# Patient Record
Sex: Female | Born: 1935 | Race: White | Hispanic: No | Smoking: Former smoker
Health system: Southern US, Community
[De-identification: ages and names within clinical notes are randomized; demographics above are authoritative.]

## PROBLEM LIST (undated history)

## (undated) ENCOUNTER — Emergency Department: Admission: EM | Payer: Medicare Other | Source: Home / Self Care

## (undated) DIAGNOSIS — E785 Hyperlipidemia, unspecified: Secondary | ICD-10-CM

## (undated) DIAGNOSIS — E119 Type 2 diabetes mellitus without complications: Secondary | ICD-10-CM

## (undated) DIAGNOSIS — E039 Hypothyroidism, unspecified: Secondary | ICD-10-CM

## (undated) DIAGNOSIS — F0390 Unspecified dementia without behavioral disturbance: Secondary | ICD-10-CM

## (undated) DIAGNOSIS — I10 Essential (primary) hypertension: Secondary | ICD-10-CM

---

## 2015-08-21 ENCOUNTER — Emergency Department
Admission: EM | Admit: 2015-08-21 | Discharge: 2015-08-22 | Disposition: A | Discharge status: Home / Self Care | Payer: Medicare Other | Attending: Emergency Medicine | Admitting: Emergency Medicine

## 2015-08-21 ENCOUNTER — Encounter: Payer: Self-pay | Admitting: *Deleted

## 2015-08-21 DIAGNOSIS — Y92129 Unspecified place in nursing home as the place of occurrence of the external cause: Secondary | ICD-10-CM | POA: Insufficient documentation

## 2015-08-21 DIAGNOSIS — E119 Type 2 diabetes mellitus without complications: Secondary | ICD-10-CM | POA: Insufficient documentation

## 2015-08-21 DIAGNOSIS — Y9389 Activity, other specified: Secondary | ICD-10-CM | POA: Insufficient documentation

## 2015-08-21 DIAGNOSIS — Y998 Other external cause status: Secondary | ICD-10-CM | POA: Diagnosis not present

## 2015-08-21 DIAGNOSIS — X58XXXA Exposure to other specified factors, initial encounter: Secondary | ICD-10-CM | POA: Diagnosis not present

## 2015-08-21 DIAGNOSIS — Z87891 Personal history of nicotine dependence: Secondary | ICD-10-CM | POA: Insufficient documentation

## 2015-08-21 DIAGNOSIS — F0390 Unspecified dementia without behavioral disturbance: Secondary | ICD-10-CM | POA: Insufficient documentation

## 2015-08-21 DIAGNOSIS — T50901A Poisoning by unspecified drugs, medicaments and biological substances, accidental (unintentional), initial encounter: Secondary | ICD-10-CM | POA: Insufficient documentation

## 2015-08-21 HISTORY — DX: Type 2 diabetes mellitus without complications: E11.9

## 2015-08-21 HISTORY — DX: Hypothyroidism, unspecified: E03.9

## 2015-08-21 HISTORY — DX: Unspecified dementia, unspecified severity, without behavioral disturbance, psychotic disturbance, mood disturbance, and anxiety: F03.90

## 2015-08-21 HISTORY — DX: Hyperlipidemia, unspecified: E78.5

## 2015-08-21 NOTE — ED Notes (Addendum)
Pt to ED from [facility] after pt was given the wrong medication. Per EMS pt was given Propafenone, of unknown amount, route, or time. On arrival, pt had no idea why here, staff at facility did not tell pt. Upon arrival pt slightly agitated wondering "why am I here." Pt made aware of medication error and pt continues to deny pain, nausea, vomiting, HA, dizziness, lightheadedness, chest pain or SOB. Vitals all wnl. Pt AAOx3, no acute distress noted.

## 2015-08-22 ENCOUNTER — Encounter: Payer: Self-pay | Admitting: Emergency Medicine

## 2015-08-22 DIAGNOSIS — E119 Type 2 diabetes mellitus without complications: Secondary | ICD-10-CM | POA: Diagnosis not present

## 2015-08-22 DIAGNOSIS — T50901A Poisoning by unspecified drugs, medicaments and biological substances, accidental (unintentional), initial encounter: Secondary | ICD-10-CM | POA: Diagnosis not present

## 2015-08-22 DIAGNOSIS — Y998 Other external cause status: Secondary | ICD-10-CM | POA: Diagnosis not present

## 2015-08-22 DIAGNOSIS — Z87891 Personal history of nicotine dependence: Secondary | ICD-10-CM | POA: Diagnosis not present

## 2015-08-22 DIAGNOSIS — X58XXXA Exposure to other specified factors, initial encounter: Secondary | ICD-10-CM | POA: Diagnosis not present

## 2015-08-22 DIAGNOSIS — Y9389 Activity, other specified: Secondary | ICD-10-CM | POA: Diagnosis not present

## 2015-08-22 DIAGNOSIS — Y92129 Unspecified place in nursing home as the place of occurrence of the external cause: Secondary | ICD-10-CM | POA: Diagnosis not present

## 2015-08-22 DIAGNOSIS — F0390 Unspecified dementia without behavioral disturbance: Secondary | ICD-10-CM | POA: Diagnosis not present

## 2015-08-22 NOTE — Discharge Instructions (Signed)
Though Ms. Axford received an accidental dose of medication tonight, more than 4 hours after the ingestion she has no evidence of any abnormal vital signs, her EKG is normal, and her cardiac tracing has been normal on the heart monitor.  Poison control was consulted and they felt that 4 hours is the appropriate amount of time for observation after the ingestion, so we believe she is safe for discharge at this time.  Please follow up with her regular doctor as needed.  Return to the emergency department with new or worsening symptoms that concern you.  Nontoxic Ingestion Your exam shows your ingestion is not likely to cause serious medical problems. Further treatment is not needed at this time. If you have vomited since your ingestion, you should not drink or eat for at least 2 to 3 hours. Then start with small sips of clear liquids until your stomach settles. You should not drink alcohol or take illegal recreational drugs or other mind-altering substances as this may worsen your condition. Sometimes the effects of drugs and other substances can be delayed. SEEK IMMEDIATE MEDICAL CARE IF:  You develop confusion, sleepiness, agitation, or difficulty walking.  You develop breathing problems, a cough, difficulty swallowing, or excess mucus.  You develop a stomach ache, repeated vomiting, or severe diarrhea.  You develop weakness, fever, or dehydration. Document Released: 12/15/2004 Document Revised: 01/30/2012 Document Reviewed: 12/08/2008 [facility] Patient Information 2015 [place], [place]. This information is not intended to replace advice given to you by your health care provider. Make sure you discuss any questions you have with your health care provider.

## 2015-08-22 NOTE — ED Notes (Signed)
MD Forbach at bedside. 

## 2015-08-22 NOTE — ED Notes (Signed)
[facility] called regarding medications specifics: 150 mg Propafenone given @ 2015, non extended release. MD Karma Greaser made aware at this time, no further orders given.

## 2015-08-22 NOTE — ED Provider Notes (Signed)
[facility] Emergency Department Provider Note  ____________________________________________  Time seen: Approximately 12:00 AM  I have reviewed the triage vital signs and the nursing notes.   HISTORY  Chief Complaint Medication Reaction  Based on her MAR, the patient has mild dementia and does not seem completely sure of her current living situation and surroundings although she is alert and conversant   HPI Miranda Clay is a 80 y.o. female with a medical history of uncomplicated diabetes, probable mild dementia, hypothyroidism, and hyperlipidemia who presents from [facility] facility after the patient was given the wrong medication by accident.The circumstances are unclear, but reportedly she was given propafenone, presumably intended for a different patient.  EMS was not told at what time the ingestion occurred, the dosing, or whether it was immediate or extended release.  The patient is completely asymptomatic and was surprised to find out why she was sent.  She specifically denies headache, dizziness, lightheadedness, nausea, vomiting, chest pain, shortness of breath, abdominal pain, dysuria, weakness, and numbness or tingling in her extremities.   Past Medical History  Diagnosis Date  . Diabetes mellitus without complication ([place])   . Dementia   . Hypothyroidism   . Hyperlipidemia     There are no active problems to display for this patient.   History reviewed. No pertinent past surgical history.  No current outpatient prescriptions on file.  Allergies Review of patient's allergies indicates no known allergies.  History reviewed. No pertinent family history.  Social History Social History  Substance Use Topics  . Smoking status: Former Research scientist (life sciences)  . Smokeless tobacco: None  . Alcohol Use: No    Review of Systems Constitutional: No fever/chills Eyes: No visual changes. ENT: No sore throat. Cardiovascular: Denies chest  pain. Respiratory: Denies shortness of breath. Gastrointestinal: No abdominal pain.  No nausea, no vomiting.  No diarrhea.  No constipation. Genitourinary: Negative for dysuria. Musculoskeletal: Negative for back pain. Skin: Negative for rash. Neurological: Negative for headaches, focal weakness or numbness.  10-point ROS otherwise negative.  ____________________________________________   PHYSICAL EXAM:  VITAL SIGNS: ED Triage Vitals  Enc Vitals Group     BP 08/21/15 2337 145/77 mmHg     Pulse Rate 08/21/15 2337 72     Resp 08/21/15 2337 15     Temp 08/21/15 2337 97.4 F (36.3 C)     Temp Source 08/21/15 2337 Oral     SpO2 08/21/15 2337 95 %     Weight 08/21/15 2337 114 lb (51.71 kg)     Height 08/21/15 2337 5\' 2"  (1.575 m)     Head Cir --      Peak Flow --      Pain Score --      Pain Loc --      Pain Edu? --      Excl. in [place]? --     Constitutional: Alert and oriented to self and location. Well appearing and in no acute distress. Eyes: Conjunctivae are normal. PERRL. EOMI. Head: Atraumatic. Nose: No congestion/rhinnorhea. Mouth/Throat: Mucous membranes are moist.  Oropharynx non-erythematous. Neck: No stridor.   Cardiovascular: Normal rate, regular rhythm. Grossly normal heart sounds.  Good peripheral circulation. Respiratory: Normal respiratory effort.  No retractions. Lungs CTAB. Gastrointestinal: Soft and nontender. No distention. No abdominal bruits. No CVA tenderness. Musculoskeletal: No lower extremity tenderness nor edema.  No joint effusions. Neurologic:  Normal speech and language. No gross focal neurologic deficits are appreciated.  Skin:  Skin is warm, dry and intact.  No rash noted. Psychiatric: Mood and affect are normal. Speech and behavior are normal.  When asked questions about her living situation she seems to confabulate somewhat, I suspect due to mild dementia and slight confusion, but she is in no distress and is very appropriate and also is  appropriately expressing dismay over having been given the wrong medication.  ____________________________________________   LABS (all labs ordered are listed, but only abnormal results are displayed)  Labs Reviewed - No data to display ____________________________________________  EKG  ED ECG REPORT I, Orbin Mayeux, the attending physician, personally viewed and interpreted this ECG.  Date: 08/22/2015 EKG Time: 00:24 Rate: 81 Rhythm: normal sinus rhythm with mild sinus arrhythmia QRS Axis: normal Intervals: normal.  QTC equals 473 ms ST/T Wave abnormalities: normal Conduction Disutrbances: none Narrative Interpretation: unremarkable  ____________________________________________  RADIOLOGY   No results found.  ____________________________________________   PROCEDURES  Procedure(s) performed: None  Critical Care performed: No ____________________________________________   INITIAL IMPRESSION / ASSESSMENT AND PLAN / ED COURSE  Pertinent labs & imaging results that were available during my care of the patient were reviewed by me and considered in my medical decision making (see chart for details).  I called the [facility] and spoke with [place].  She stated that there are no specific labs to check and agreed with my plan to get an EKG.  She stated that it is important to know whether it was immediate release or extended release.  If the patient has no sign of arrhythmias after 4 hours after the immediate release dose, or 8 hours after the extended-release dose, the patient is safe for return to her nursing facility.  The patient's nurse is attempting to call and obtain more information from the nursing facility so that we can know exactly when the medication was given.  ----------------------------------------- 1:00 AM on 08/22/2015 -----------------------------------------  Our pharmacy technician called the facility and found out that the  medication given was immediate release 150 mg and that it was administered at 20:15.  This means that she has artery been observed for more than the recommended 4 hours after dose was given.  Her EKG is reassuring, she has had no arrhythmias on the heart monitor, and her blood pressure is appropriate.  Her family is now at the bedside and I discussed all of this with them extensively.  They are going to take the patient home.  She remains asymptomatic and I believe there are no other interventions that are indicated at this time. ____________________________________________  FINAL CLINICAL IMPRESSION(S) / ED DIAGNOSES  Final diagnoses:  Accidental medication error      NEW MEDICATIONS STARTED DURING THIS VISIT:  New Prescriptions   No medications on file     Hinda Kehr, MD 08/22/15 0104

## 2016-01-10 ENCOUNTER — Emergency Department: Payer: Medicare Other

## 2016-01-10 ENCOUNTER — Emergency Department
Admission: EM | Admit: 2016-01-10 | Discharge: 2016-01-10 | Disposition: A | Discharge status: Home / Self Care | Payer: Medicare Other | Attending: Emergency Medicine | Admitting: Emergency Medicine

## 2016-01-10 DIAGNOSIS — F0390 Unspecified dementia without behavioral disturbance: Secondary | ICD-10-CM | POA: Insufficient documentation

## 2016-01-10 DIAGNOSIS — Z7984 Long term (current) use of oral hypoglycemic drugs: Secondary | ICD-10-CM | POA: Diagnosis not present

## 2016-01-10 DIAGNOSIS — R531 Weakness: Secondary | ICD-10-CM | POA: Insufficient documentation

## 2016-01-10 DIAGNOSIS — N390 Urinary tract infection, site not specified: Secondary | ICD-10-CM | POA: Diagnosis not present

## 2016-01-10 DIAGNOSIS — Z792 Long term (current) use of antibiotics: Secondary | ICD-10-CM | POA: Insufficient documentation

## 2016-01-10 DIAGNOSIS — Z87891 Personal history of nicotine dependence: Secondary | ICD-10-CM | POA: Insufficient documentation

## 2016-01-10 DIAGNOSIS — E119 Type 2 diabetes mellitus without complications: Secondary | ICD-10-CM | POA: Insufficient documentation

## 2016-01-10 DIAGNOSIS — Z7982 Long term (current) use of aspirin: Secondary | ICD-10-CM | POA: Diagnosis not present

## 2016-01-10 DIAGNOSIS — R4182 Altered mental status, unspecified: Secondary | ICD-10-CM | POA: Diagnosis present

## 2016-01-10 DIAGNOSIS — R410 Disorientation, unspecified: Secondary | ICD-10-CM | POA: Diagnosis not present

## 2016-01-10 DIAGNOSIS — Z79899 Other long term (current) drug therapy: Secondary | ICD-10-CM | POA: Insufficient documentation

## 2016-01-10 LAB — URINALYSIS COMPLETE WITH MICROSCOPIC (ARMC ONLY)
Bilirubin Urine: NEGATIVE
GLUCOSE, UA: 50 mg/dL — AB
Ketones, ur: NEGATIVE mg/dL
Nitrite: POSITIVE — AB
PROTEIN: NEGATIVE mg/dL
Specific Gravity, Urine: 1.011 (ref 1.005–1.030)
pH: 7 (ref 5.0–8.0)

## 2016-01-10 LAB — CBC WITH DIFFERENTIAL/PLATELET
BASOS ABS: 0.1 10*3/uL (ref 0–0.1)
Basophils Relative: 1 %
Eosinophils Absolute: 0.3 10*3/uL (ref 0–0.7)
Eosinophils Relative: 4 %
HEMATOCRIT: 40.7 % (ref 35.0–47.0)
HEMOGLOBIN: 13.6 g/dL (ref 12.0–16.0)
LYMPHS ABS: 2.2 10*3/uL (ref 1.0–3.6)
LYMPHS PCT: 28 %
MCH: 31.2 pg (ref 26.0–34.0)
MCHC: 33.4 g/dL (ref 32.0–36.0)
MCV: 93.5 fL (ref 80.0–100.0)
Monocytes Absolute: 0.7 10*3/uL (ref 0.2–0.9)
Monocytes Relative: 9 %
NEUTROS ABS: 4.5 10*3/uL (ref 1.4–6.5)
Neutrophils Relative %: 58 %
Platelets: 283 10*3/uL (ref 150–440)
RBC: 4.35 MIL/uL (ref 3.80–5.20)
RDW: 13.9 % (ref 11.5–14.5)
WBC: 7.7 10*3/uL (ref 3.6–11.0)

## 2016-01-10 LAB — COMPREHENSIVE METABOLIC PANEL
ALK PHOS: 61 U/L (ref 38–126)
ALT: 13 U/L — AB (ref 14–54)
AST: 19 U/L (ref 15–41)
Albumin: 4 g/dL (ref 3.5–5.0)
Anion gap: 8 (ref 5–15)
BUN: 14 mg/dL (ref 6–20)
CALCIUM: 9.1 mg/dL (ref 8.9–10.3)
CHLORIDE: 107 mmol/L (ref 101–111)
CO2: 27 mmol/L (ref 22–32)
CREATININE: 0.42 mg/dL — AB (ref 0.44–1.00)
Glucose, Bld: 202 mg/dL — ABNORMAL HIGH (ref 65–99)
Potassium: 3.4 mmol/L — ABNORMAL LOW (ref 3.5–5.1)
Sodium: 142 mmol/L (ref 135–145)
Total Bilirubin: 0.4 mg/dL (ref 0.3–1.2)
Total Protein: 6.8 g/dL (ref 6.5–8.1)

## 2016-01-10 LAB — TROPONIN I

## 2016-01-10 MED ORDER — DEXTROSE 5 % IV SOLN
1.0000 g | INTRAVENOUS | Status: AC
  Administered 2016-01-10: 1 g via INTRAVENOUS
  Filled 2016-01-10: qty 10

## 2016-01-10 MED ORDER — CEPHALEXIN 500 MG PO CAPS: 500.0000 mg | ORAL_CAPSULE | Freq: Two times a day (BID) | ORAL | Status: DC

## 2016-01-10 NOTE — ED Notes (Signed)
Pt repeatedly asking how she came to the ED. Pt does not remember the trip, or why she is here. Pt has been informed why she is in the ED, but needs to be continually reminded.

## 2016-01-10 NOTE — Discharge Instructions (Signed)
Ms. Hallen is a urinary tract infection that we believe is likely causing her confusion.  Her lab results and physical exam are otherwise quite reassuring and we believe that she will improve with outpatient antibiotics.  She received 1 dose of ceftriaxone 1 g IV while in the emergency department and she should take her prescribed medication as indicated starting today.  Please follow up with her regular doctor at the next available opportunity.  If she develops new or worsening symptoms that concern you, please return to the emergency department.   Urinary Tract Infection Urinary tract infections (UTIs) can develop anywhere along your urinary tract. Your urinary tract is your body's drainage system for removing wastes and extra water. Your urinary tract includes two kidneys, two ureters, a bladder, and a urethra. Your kidneys are a pair of bean-shaped organs. Each kidney is about the size of your fist. They are located below your ribs, one on each side of your spine. CAUSES Infections are caused by microbes, which are microscopic organisms, including fungi, viruses, and bacteria. These organisms are so small that they can only be seen through a microscope. Bacteria are the microbes that most commonly cause UTIs. SYMPTOMS  Symptoms of UTIs may vary by age and gender of the patient and by the location of the infection. Symptoms in young women typically include a frequent and intense urge to urinate and a painful, burning feeling in the bladder or urethra during urination. Older women and men are more likely to be tired, shaky, and weak and have muscle aches and abdominal pain. A fever may mean the infection is in your kidneys. Other symptoms of a kidney infection include pain in your back or sides below the ribs, nausea, and vomiting. DIAGNOSIS To diagnose a UTI, your caregiver will ask you about your symptoms. Your caregiver will also ask you to provide a urine sample. The urine sample will be tested for  bacteria and white blood cells. White blood cells are made by your body to help fight infection. TREATMENT  Typically, UTIs can be treated with medication. Because most UTIs are caused by a bacterial infection, they usually can be treated with the use of antibiotics. The choice of antibiotic and length of treatment depend on your symptoms and the type of bacteria causing your infection. HOME CARE INSTRUCTIONS  If you were prescribed antibiotics, take them exactly as your caregiver instructs you. Finish the medication even if you feel better after you have only taken some of the medication.  Drink enough water and fluids to keep your urine clear or pale yellow.  Avoid caffeine, tea, and carbonated beverages. They tend to irritate your bladder.  Empty your bladder often. Avoid holding urine for long periods of time.  Empty your bladder before and after sexual intercourse.  After a bowel movement, women should cleanse from front to back. Use each tissue only once. SEEK MEDICAL CARE IF:   You have back pain.  You develop a fever.  Your symptoms do not begin to resolve within 3 days. SEEK IMMEDIATE MEDICAL CARE IF:   You have severe back pain or lower abdominal pain.  You develop chills.  You have nausea or vomiting.  You have continued burning or discomfort with urination. MAKE SURE YOU:   Understand these instructions.  Will watch your condition.  Will get help right away if you are not doing well or get worse.   This information is not intended to replace advice given to you by your health  care provider. Make sure you discuss any questions you have with your health care provider.   Document Released: 08/17/2005 Document Revised: 07/29/2015 Document Reviewed: 12/16/2011 Elsevier Interactive Patient Education [employer].  Delirium Delirium is a state of mental confusion. It comes on quickly and causes significant changes in a person's thinking and behavior. People  with delirium usually have trouble paying attention to what is going on or knowing where they are. They may become very withdrawn or very emotional and unable to sit still. They may even see or feel things that are not there (hallucinations). Delirium is a sign of a serious underlying medical condition. CAUSES Delirium occurs when something suddenly affects the signals that the brain sends out. Brain signals can be affected by anything that puts severe stress on the body and brain and causes brain chemicals to be out of balance. The most common causes of delirium include:  Infections. These may be bacterial, viral, fungal, or protozoal.  Medicines. These include many over-the-counter and prescription medicines.  Recreational drugs.  Substance withdrawal. This occurs with sudden discontinuation of alcohol, certain medicines, or recreational drugs.  Surgery.  Sudden vascular events, such as stroke, brain hemorrhage, and severe migraine.  Other brain disorders, such as tumors, seizures, and physical head trauma.  Metabolic disorders, such as kidney or liver failure.  Low blood oxygen (anoxia). This may occur with lung disease, cardiac arrest, or carbon monoxide poisoning.  Hormone imbalances (endocrinopathies), such as an overactive thyroid (hyperthyroidism) or underactive thyroid (hypothyroidism).  Vitamin deficiencies. RISK FACTORS This condition is more likely to develop in:  Children.  Older people.  People who live alone.  People who have vision loss or hearing loss.  People who have existing brain disease, such as dementia.  People who have long-lasting (chronic) medical conditions, such as heart disease.  People who are hospitalized for long periods of time. SYMPTOMS Delirium starts with a sudden change in a person's thinking or behavior. Symptoms come and go (fluctuate) over time, and they are often worse at the end of the day. Symptoms include:  Not being able to  stay awake (drowsiness) or pay attention.  Being confused about places, time, and people.  Forgetfulness.  Having extreme energy levels. These may be low or high.  Changes in sleep patterns.  Extreme mood swings, such as anger or anxiety.  Focusing on things or ideas that are not important.  Rambling and senseless talking.  Difficulty speaking, understanding speech, or both.  Hallucinations.  Tremor or unsteady gait. DIAGNOSIS People with delirium may not realize that they have the condition. Often, a family member or health care provider is the first person to notice the changes. The health care provider will obtain a detailed history of current symptoms, medical issues, medicines, and recreational drug use. The health care provider will perform a mental status examination by:  Asking questions to check for confusion.  Watching for abnormal behavior. The health care provider may perform a physical exam and order lab tests or additional studies to determine the cause of the delirium. TREATMENT Treatment of delirium depends on the cause and severity. Delirium usually goes away within days or weeks of treating the underlying cause. In the meantime, the person should not be left alone because he or she may accidentally cause self-harm. Treatment includes supportive care, such as:  Increased light during the day and decreased light at night.  Low noise level.  Uninterrupted sleep.  A regular daily schedule.  Clocks and calendars to  help with orientation.  Familiar objects, including the person's pictures and clothing.  Frequent visits from familiar family and friends.  Healthy diet.  Exercise. In more severe cases of delirium, medicine may be prescribed to help the person to keep calm and think more clearly. HOME CARE INSTRUCTIONS  Any supportive care should be continued as told by the health care provider.  All medicines should be used as told by the health care  provider. This is important.  The health care provider should be consulted before over-the-counter medicines, herbs, or supplements are used.  All follow-up visits should be kept as told by the health care provider. This is important.  Alcohol and recreational drugs should be avoided as told by the health care provider. SEEK MEDICAL CARE IF:  Symptoms do not get better or they become worse.  New symptoms of delirium develop.  Caring for the person at home does not seem safe.  Eating, drinking, or communicating stops.  There are side effects of medicines, such as changes in sleep patterns, dizziness, weight gain, restlessness, movement changes, or tremors. SEEK IMMEDIATE MEDICAL CARE IF:  Serious thoughts occur about self-harm or about hurting others.  There are serious side effects of medicine, such as:  Swelling of the face, lips, tongue, or throat.  Fever, confusion, muscle spasms, or seizures.   This information is not intended to replace advice given to you by your health care provider. Make sure you discuss any questions you have with your health care provider.   Document Released: 08/01/2012 Document Revised: 03/24/2015 Document Reviewed: 12/31/2014 Elsevier Interactive Patient Education [employer].

## 2016-01-10 NOTE — ED Notes (Signed)
Patient ambulatory to toilet

## 2016-01-10 NOTE — ED Notes (Signed)
Pt asked if her daughter knew that the pt was in the ED. Pt then called her daughter and left a voicemail that she was in the ED. Pt asked approx 5 minutes later if her daughter knew she was in the ED. Pt asked again how she got the ED, and why she is here.

## 2016-01-10 NOTE — ED Notes (Signed)
Per EMS: Pt comes from [facility] Pt c/o confusion/disorientation, dyspnea, weakness, and dizziness. Pt walked to nurses station at [facility], and appeared confused to staff. Pt does not remember incident. Pt has history of dementia.  Pt normally walks 1.5 miles daily, currently feels unable to walk. Pt states: "I just feel off"

## 2016-01-10 NOTE — ED Notes (Signed)
Spoke with daughter on the phone who is aware of patient's discharge back to [facility]. Daughter also unsure if we were told she had a dx of dementia which I told her the previous shift nurse had documented her hx of dementia.

## 2016-01-10 NOTE — ED Notes (Signed)
Pt reports she does not remember where she was when EMS came and got her. Pt reports she does not know why she is in the hospital

## 2016-01-10 NOTE — ED Notes (Signed)
Patient transported to X-ray 

## 2016-01-10 NOTE — ED Provider Notes (Signed)
[facility] Emergency Department Provider Note  ____________________________________________  Time seen: Approximately 5:42 AM  I have reviewed the triage vital signs and the nursing notes.   HISTORY  Chief Complaint Altered Mental Status; Weakness; and Shortness of Breath    HPI Miranda Clay is a 81 y.o. female past medical history that includes mild dementia who presents with altered mental status primarily manifesting as confusion.  She has been a little bit weaker than normal according to the EMS report from [facility].  She is normally quite active and healthy walking 1.5 miles per day.  Upon arrival to the emergency department she was confused and not certain why she was here.  She denies any current abnormal feelings and specifically denies fever/chills, chest pain, shortness of breath, nausea, vomiting, abdominal pain, dysuria.  She does state that she has been going to the bathroom quite frequently and she has urinated multiple times in the emergency department while awaiting my evaluation.  The main complaint at this time seems to be confusion and she does require frequent redirection in the emergency department.  Overall the symptoms are reportedly quite a bit worse than her baseline mild dementia but nothing specifically has made them better or worse.  The onset was gradual.   Past Medical History  Diagnosis Date  . Diabetes mellitus without complication ([place])   . Dementia   . Hypothyroidism   . Hyperlipidemia     There are no active problems to display for this patient.   History reviewed. No pertinent past surgical history.  Current Outpatient Rx  Name  Route  Sig  Dispense  Refill  . aspirin 81 MG tablet   Oral   Take 81 mg by mouth daily.         . Cholecalciferol (VITAMIN D) 2000 UNITS tablet   Oral   Take 2,000 Units by mouth daily.         . Coenzyme Q10 (CO Q 10) 100 MG CAPS   Oral   Take 1 capsule by mouth daily.         Marland Kitchen  levothyroxine (SYNTHROID, LEVOTHROID) 25 MCG tablet   Oral   Take 25 mcg by mouth daily before breakfast.         . meclizine (ANTIVERT) 25 MG tablet   Oral   Take 25 mg by mouth 3 (three) times daily as needed for dizziness.         . memantine (NAMENDA XR) 28 MG CP24 24 hr capsule   Oral   Take 28 mg by mouth daily.         . metFORMIN (GLUCOPHAGE) 1000 MG tablet   Oral   Take 1,000 mg by mouth 2 (two) times daily with a meal.         . mirtazapine (REMERON) 15 MG tablet   Oral   Take 7.5 mg by mouth at bedtime.         . pravastatin (PRAVACHOL) 20 MG tablet   Oral   Take 20 mg by mouth daily.         . Rivastigmine 13.3 MG/24HR PT24   Transdermal   Place 1 patch onto the skin daily.         . verapamil (CALAN-SR) 120 MG CR tablet   Oral   Take 120 mg by mouth daily.         . vitamin B-12 (CYANOCOBALAMIN) 1000 MCG tablet   Oral   Take 1,000 mcg by mouth  daily.         . cephALEXin (KEFLEX) 500 MG capsule   Oral   Take 1 capsule (500 mg total) by mouth 2 (two) times daily.   14 capsule   0     Allergies Review of patient's allergies indicates no known allergies.  No family history on file.  Social History Social History  Substance Use Topics  . Smoking status: Former Research scientist (life sciences)  . Smokeless tobacco: None  . Alcohol Use: No    Review of Systems Constitutional: No fever/chills Eyes: No visual changes. ENT: No sore throat. Cardiovascular: Denies chest pain. Respiratory: Denies shortness of breath. Gastrointestinal: No abdominal pain.  No nausea, no vomiting.  No diarrhea.  No constipation. Genitourinary: Negative for dysuria. Musculoskeletal: Negative for back pain. Skin: Negative for rash. Neurological: Negative for headaches, focal weakness or numbness.  Confusion.  10-point ROS otherwise negative.  ____________________________________________   PHYSICAL EXAM:  VITAL SIGNS: ED Triage Vitals  Enc Vitals Group     BP 01/10/16  0224 150/63 mmHg     Pulse Rate 01/10/16 0229 66     Resp 01/10/16 0229 11     Temp 01/10/16 0229 97.5 F (36.4 C)     Temp Source 01/10/16 0229 Oral     SpO2 01/10/16 0229 94 %     Weight 01/10/16 0229 115 lb (52.164 kg)     Height 01/10/16 0229 5\' 2"  (1.575 m)     Head Cir --      Peak Flow --      Pain Score --      Pain Loc --      Pain Edu? --      Excl. in [place]? --     Constitutional: Alert and oriented at the time of my evaluation. Well appearing and in no acute distress. Eyes: Conjunctivae are normal. PERRL. EOMI. Head: Atraumatic. Nose: No congestion/rhinnorhea. Mouth/Throat: Mucous membranes are moist.  Oropharynx non-erythematous. Neck: No stridor.   Cardiovascular: Normal rate, regular rhythm. Grossly normal heart sounds.  Good peripheral circulation. Respiratory: Normal respiratory effort.  No retractions. Lungs CTAB. Gastrointestinal: Soft and nontender. No distention. No abdominal bruits. No CVA tenderness. Musculoskeletal: No lower extremity tenderness nor edema.  No joint effusions. Neurologic:  Normal speech and language. No gross focal neurologic deficits are appreciated.  The patient is ambulatory to and from the bathroom without assistance multiple times during her emergency department stay. Skin:  Skin is warm, dry and intact. No rash noted. Psychiatric: Mood and affect are normal. Speech and behavior are normal.  ____________________________________________   LABS (all labs ordered are listed, but only abnormal results are displayed)  Labs Reviewed  COMPREHENSIVE METABOLIC PANEL - Abnormal; Notable for the following:    Potassium 3.4 (*)    Glucose, Bld 202 (*)    Creatinine, Ser 0.42 (*)    ALT 13 (*)    All other components within normal limits  URINALYSIS COMPLETEWITH MICROSCOPIC ([facility] ONLY) - Abnormal; Notable for the following:    Color, Urine YELLOW (*)    APPearance HAZY (*)    Glucose, UA 50 (*)    Hgb urine dipstick 1+ (*)    Nitrite  POSITIVE (*)    Leukocytes, UA 1+ (*)    Bacteria, UA FEW (*)    Squamous Epithelial / LPF 0-5 (*)    All other components within normal limits  URINE CULTURE  TROPONIN I  CBC WITH DIFFERENTIAL/PLATELET   ____________________________________________  EKG  ED ECG REPORT  I, Knut Rondinelli, the attending physician, personally viewed and interpreted this ECG.  Date: 01/10/2016 EKG Time: 02:29 Rate: 66 Rhythm: normal sinus rhythm QRS Axis: normal Intervals: normal ST/T Wave abnormalities: normal Conduction Disturbances: none Narrative Interpretation: unremarkable  ____________________________________________  RADIOLOGY   Dg Chest 2 View  01/10/2016  CLINICAL DATA:  Confusion and disorientation, dyspnea, weakness, and dizziness. History of dementia. EXAM: CHEST  2 VIEW COMPARISON:  None. FINDINGS: The heart size and mediastinal contours are within normal limits. Both lungs are clear. The visualized skeletal structures are unremarkable. IMPRESSION: No active cardiopulmonary disease. Electronically Signed   By: Lucienne Capers M.D.   On: 01/10/2016 03:33    ____________________________________________   PROCEDURES  Procedure(s) performed: None  Critical Care performed: No ____________________________________________   INITIAL IMPRESSION / ASSESSMENT AND PLAN / ED COURSE  Pertinent labs & imaging results that were available during my care of the patient were reviewed by me and considered in my medical decision making (see chart for details).  The patient has a urinary tract infection which I suspect is causing her confusion (mild delirium on top of mild chronic dementia).  However, she is very well-appearing with completely normal vital signs and otherwise unremarkable labs.  Give her dose of ceftriaxone 1 g IV several hours ago and she has tolerated it well.  The urine has been sent for culture.  Given that she is at an assisted living facility and doing quite well at this  time, I will discharge her home rather than risk worsening her delirium by bringing her into the hospital when she can adequately be treated for her UTI with oral antibiotics; I believe that hospitalization would actually cause her to decompensate more than the actual infection.  I discussed going home with the patient and [facility] is [facility].  She states she feels well and is very pleasant and appropriately interactive during our interview.  ____________________________________________  FINAL CLINICAL IMPRESSION(S) / ED DIAGNOSES  Final diagnoses:  UTI (lower urinary tract infection)  Confusion      NEW MEDICATIONS STARTED DURING THIS VISIT:  New Prescriptions   CEPHALEXIN (KEFLEX) 500 MG CAPSULE    Take 1 capsule (500 mg total) by mouth 2 (two) times daily.     Hinda Kehr, MD 01/10/16 [phone]

## 2016-01-12 LAB — URINE CULTURE
Culture: >=100000
SPECIAL REQUESTS: NORMAL

## 2017-09-13 ENCOUNTER — Emergency Department
Admission: EM | Admit: 2017-09-13 | Discharge: 2017-09-13 | Disposition: A | Discharge status: Home / Self Care | Payer: Medicare Other | Attending: Emergency Medicine | Admitting: Emergency Medicine

## 2017-09-13 ENCOUNTER — Encounter: Payer: Self-pay | Admitting: *Deleted

## 2017-09-13 DIAGNOSIS — I83899 Varicose veins of unspecified lower extremities with other complications: Secondary | ICD-10-CM | POA: Insufficient documentation

## 2017-09-13 DIAGNOSIS — Z87891 Personal history of nicotine dependence: Secondary | ICD-10-CM | POA: Diagnosis not present

## 2017-09-13 DIAGNOSIS — F0390 Unspecified dementia without behavioral disturbance: Secondary | ICD-10-CM | POA: Diagnosis not present

## 2017-09-13 DIAGNOSIS — E039 Hypothyroidism, unspecified: Secondary | ICD-10-CM | POA: Diagnosis not present

## 2017-09-13 DIAGNOSIS — Z79899 Other long term (current) drug therapy: Secondary | ICD-10-CM | POA: Diagnosis not present

## 2017-09-13 DIAGNOSIS — E119 Type 2 diabetes mellitus without complications: Secondary | ICD-10-CM | POA: Insufficient documentation

## 2017-09-13 DIAGNOSIS — Z7982 Long term (current) use of aspirin: Secondary | ICD-10-CM | POA: Insufficient documentation

## 2017-09-13 DIAGNOSIS — Z7984 Long term (current) use of oral hypoglycemic drugs: Secondary | ICD-10-CM | POA: Diagnosis not present

## 2017-09-13 NOTE — ED Triage Notes (Signed)
Pt brought in via ems from [facility].  Pt struck her left lower leg on the toilet causing a vericose vein to bleed.  Bleeding controlled

## 2017-09-13 NOTE — Discharge Instructions (Signed)
Keep dressing on the wound area until tomorrow.  Monitor for recurrence of bleeding.  If you note any bleeding do not hesitate to follow-up with primary care provider or return to the emergency department.

## 2017-09-13 NOTE — ED Provider Notes (Signed)
[facility] Emergency Department Provider Note   ____________________________________________   I have reviewed the triage vital signs and the nursing notes.   HISTORY  Chief Complaint Leg Injury    HPI Miranda Clay is a 82 y.o. female presents emergency department with with bleeding from a right lower leg varicose vein she hit on base of a toilet while going to the bathroom earlier this evening. Nursing home staff held direct pressure over the injury prior to EMS arrival.  EMS reported no bleeding during their assessment and maintaining the bandaging during transport.  No bleeding noted during history and assessment.  Intact pulses to the lower leg. Patient is an Alzheimer's dementia patient and was unable to provide history of current injury. Patient denies fever, chills, headache, vision changes, chest pain, chest tightness, shortness of breath, abdominal pain, nausea and vomiting.  Past Medical History:  Diagnosis Date  . Dementia   . Diabetes mellitus without complication ([place])   . Hyperlipidemia   . Hypothyroidism     There are no active problems to display for this patient.   No past surgical history on file.  Prior to Admission medications   Medication Sig Start Date End Date Taking? Authorizing Provider  aspirin 81 MG tablet Take 81 mg by mouth daily.    [provider]  cephALEXin (KEFLEX) 500 MG capsule Take 1 capsule (500 mg total) by mouth 2 (two) times daily. 01/10/16   Hinda Kehr, MD  Cholecalciferol (VITAMIN D) 2000 UNITS tablet Take 2,000 Units by mouth daily.    [provider]  Coenzyme Q10 (CO Q 10) 100 MG CAPS Take 1 capsule by mouth daily.    [provider]  levothyroxine (SYNTHROID, LEVOTHROID) 25 MCG tablet Take 25 mcg by mouth daily before breakfast.    [provider]  meclizine (ANTIVERT) 25 MG tablet Take 25 mg by mouth 3 (three) times daily as needed for dizziness.    [provider]  memantine (NAMENDA XR) 28 MG CP24 24 hr capsule Take 28 mg by mouth daily.    [provider]  metFORMIN (GLUCOPHAGE) 1000 MG tablet Take 1,000 mg by mouth 2 (two) times daily with a meal.    [provider]  mirtazapine (REMERON) 15 MG tablet Take 7.5 mg by mouth at bedtime.    [provider]  pravastatin (PRAVACHOL) 20 MG tablet Take 20 mg by mouth daily.    [provider]  Rivastigmine 13.3 MG/24HR PT24 Place 1 patch onto the skin daily.    [provider]  verapamil (CALAN-SR) 120 MG CR tablet Take 120 mg by mouth daily.    [provider]  vitamin B-12 (CYANOCOBALAMIN) 1000 MCG tablet Take 1,000 mcg by mouth daily.    [provider]    Allergies Patient has no known allergies.  No family history on file.  Social History Social History  Substance Use Topics  . Smoking status: Former Research scientist (life sciences)  . Smokeless tobacco: Never Used  . Alcohol use No    Review of Systems Constitutional: Negative for fever/chills Cardiovascular: Denies chest pain. Respiratory: Denies shortness of breath. Skin: Negative for rash. Small wound area where skin tear occurred over varicose vein. Hemorrhage controlled. Neurological: Negative for headaches.  Negative focal weakness or numbness. Negative for loss of consciousness. Able to ambulate. ____________________________________________   PHYSICAL EXAM:  VITAL SIGNS: ED Triage Vitals  Enc Vitals Group     BP 09/13/17 2139 (!) 161/73  Pulse Rate 09/13/17 2139 96     Resp 09/13/17 2139 20     Temp 09/13/17 2139 98.1 F (36.7 C)     Temp Source 09/13/17 2139 Oral     SpO2 09/13/17 2139 99 %     Weight 09/13/17 2140 118 lb (53.5 kg)     Height 09/13/17 2140 5\' 2"  (1.575 m)     Head Circumference --      Peak Flow --      Pain Score --      Pain Loc --      Pain Edu? --      Excl. in [place]? --     Constitutional: Alert and oriented. Well appearing and in no acute distress.    Head: Normocephalic and atraumatic. Cardiovascular: Normal rate, regular rhythm.  Respiratory: Normal respiratory effort without tachypnea or retractions.  Musculoskeletal: Nontender with normal range of motion in all extremities. Neurologic: Normal speech and language.  Skin:  Skin is warm, dry and intact. No rash noted.Small wound area where skin tear occurred over varicose vein. Hemorrhage controlled. Intact pedal and medial malleoli pulses along the right lower leg. No active bleeding noted. Psychiatric: Mood and affect are normal. Speech and behavior are normal. Patient exhibits appropriate insight and judgement.  ____________________________________________   LABS (all labs ordered are listed, but only abnormal results are displayed)  Labs Reviewed - No data to display ____________________________________________  EKG None ____________________________________________  RADIOLOGY None ____________________________________________   PROCEDURES  Procedure(s) performed: Varicose vein wound area along the posterior lateral right lower leg examined. Hemorrhage controlled and wound area cleaned with saline and gauze. Redressed with 4 x 4 and covered with 2 inch rolled gauze.    Critical Care performed: no ____________________________________________   INITIAL IMPRESSION / ASSESSMENT AND PLAN / ED COURSE  Pertinent labs & imaging results that were available during my care of the patient were reviewed by me and considered in my medical decision making (see chart for details).  Patient presents to emergency department with right lower leg injury to small varicose vein with hemorrhage resolved. History and physical exam findings are reassuring small varicose vein injury is contained and no further hemorrhaging is present. Injury area has been redressed with 4 x 4 gauze and covered with 2 inch rolled gauze. Care instructions have been sent back with EMS to be relayed on to nursing  home staff. Patient advised to follow up with PCP as needed or return to the emergency department if symptoms return or worsen.  ____________________________________________   FINAL CLINICAL IMPRESSION(S) / ED DIAGNOSES  Final diagnoses:  Bleeding from varicose vein       NEW MEDICATIONS STARTED DURING THIS VISIT:  Discharge Medication List as of 09/13/2017  9:43 PM       Note:  This document was prepared using Dragon voice recognition software and may include unintentional dictation errors.    Jerolyn Shin, PA-C 09/14/17 0125    Carrie Mew, MD 09/14/17 [phone]

## 2017-09-13 NOTE — ED Notes (Addendum)
Pt struck left lower leg on the toilet causing a vericose vein to bleed.  No bleeding on arrival to er.  Left leg cleaned and dressed and pt returned back to [facility] via ems.  Pt alert  Hx dementia pt

## 2018-07-13 ENCOUNTER — Emergency Department: Payer: Medicare Other

## 2018-07-13 ENCOUNTER — Other Ambulatory Visit: Payer: Self-pay

## 2018-07-13 ENCOUNTER — Emergency Department
Admission: EM | Admit: 2018-07-13 | Discharge: 2018-07-13 | Disposition: A | Discharge status: Home / Self Care | Payer: Medicare Other | Attending: Emergency Medicine | Admitting: Emergency Medicine

## 2018-07-13 DIAGNOSIS — R270 Ataxia, unspecified: Secondary | ICD-10-CM | POA: Diagnosis not present

## 2018-07-13 DIAGNOSIS — Z7902 Long term (current) use of antithrombotics/antiplatelets: Secondary | ICD-10-CM | POA: Insufficient documentation

## 2018-07-13 DIAGNOSIS — F0390 Unspecified dementia without behavioral disturbance: Secondary | ICD-10-CM | POA: Diagnosis not present

## 2018-07-13 DIAGNOSIS — Z87891 Personal history of nicotine dependence: Secondary | ICD-10-CM | POA: Diagnosis not present

## 2018-07-13 DIAGNOSIS — E039 Hypothyroidism, unspecified: Secondary | ICD-10-CM | POA: Insufficient documentation

## 2018-07-13 DIAGNOSIS — E119 Type 2 diabetes mellitus without complications: Secondary | ICD-10-CM | POA: Diagnosis not present

## 2018-07-13 DIAGNOSIS — Z79899 Other long term (current) drug therapy: Secondary | ICD-10-CM | POA: Diagnosis not present

## 2018-07-13 DIAGNOSIS — Z7982 Long term (current) use of aspirin: Secondary | ICD-10-CM | POA: Insufficient documentation

## 2018-07-13 DIAGNOSIS — R4182 Altered mental status, unspecified: Secondary | ICD-10-CM

## 2018-07-13 DIAGNOSIS — Z7984 Long term (current) use of oral hypoglycemic drugs: Secondary | ICD-10-CM | POA: Insufficient documentation

## 2018-07-13 LAB — COMPREHENSIVE METABOLIC PANEL
ALT: 9 U/L (ref 0–44)
AST: 16 U/L (ref 15–41)
Albumin: 3.2 g/dL — ABNORMAL LOW (ref 3.5–5.0)
Alkaline Phosphatase: 52 U/L (ref 38–126)
Anion gap: 7 (ref 5–15)
BILIRUBIN TOTAL: 0.4 mg/dL (ref 0.3–1.2)
BUN: 23 mg/dL (ref 8–23)
CALCIUM: 9.1 mg/dL (ref 8.9–10.3)
CO2: 27 mmol/L (ref 22–32)
CREATININE: 0.62 mg/dL (ref 0.44–1.00)
Chloride: 106 mmol/L (ref 98–111)
GFR calc non Af Amer: >60 mL/min (ref >60)
Glucose, Bld: 285 mg/dL — ABNORMAL HIGH (ref 70–99)
Potassium: 3.8 mmol/L (ref 3.5–5.1)
Sodium: 140 mmol/L (ref 135–145)
TOTAL PROTEIN: 6.4 g/dL — AB (ref 6.5–8.1)

## 2018-07-13 LAB — CBC WITH DIFFERENTIAL/PLATELET
BASOS ABS: 0.1 10*3/uL (ref 0–0.1)
BASOS PCT: 1 %
EOS ABS: 0.3 10*3/uL (ref 0–0.7)
Eosinophils Relative: 3 %
HCT: 37.4 % (ref 35.0–47.0)
Hemoglobin: 12.6 g/dL (ref 12.0–16.0)
Lymphocytes Relative: 18 %
Lymphs Abs: 1.9 10*3/uL (ref 1.0–3.6)
MCH: 31.3 pg (ref 26.0–34.0)
MCHC: 33.6 g/dL (ref 32.0–36.0)
MCV: 93.2 fL (ref 80.0–100.0)
Monocytes Absolute: 1.3 10*3/uL — ABNORMAL HIGH (ref 0.2–0.9)
Monocytes Relative: 13 %
Neutro Abs: 6.7 10*3/uL — ABNORMAL HIGH (ref 1.4–6.5)
Neutrophils Relative %: 65 %
PLATELETS: 318 10*3/uL (ref 150–440)
RBC: 4.01 MIL/uL (ref 3.80–5.20)
RDW: 13.9 % (ref 11.5–14.5)
WBC: 10.3 10*3/uL (ref 3.6–11.0)

## 2018-07-13 LAB — URINALYSIS, COMPLETE (UACMP) WITH MICROSCOPIC
Bacteria, UA: NONE SEEN
Bilirubin Urine: NEGATIVE
HGB URINE DIPSTICK: NEGATIVE
Ketones, ur: 20 mg/dL — AB
Leukocytes, UA: NEGATIVE
NITRITE: NEGATIVE
PH: 5 (ref 5.0–8.0)
Protein, ur: 30 mg/dL — AB
Specific Gravity, Urine: 1.026 (ref 1.005–1.030)

## 2018-07-13 LAB — AMMONIA: AMMONIA: 18 umol/L (ref 9–35)

## 2018-07-13 LAB — TSH: TSH: 2.587 u[IU]/mL (ref 0.350–4.500)

## 2018-07-13 LAB — T4, FREE: FREE T4: 1.11 ng/dL (ref 0.82–1.77)

## 2018-07-13 LAB — TROPONIN I

## 2018-07-13 NOTE — ED Triage Notes (Addendum)
Pt to ED via [facility], pt from [facility] where staff states pt was having AMS yesterday worse today. Pt alert and oriented to situation. EMS states CBg 308 pt was given 3105ml fluids and sat at 88% so they put her on 3L Nasal Cannula. EMS gave 1 narcan. Pt arrived 89-90% room air, placed on 2L nasal cannula.

## 2018-07-13 NOTE — ED Notes (Signed)
Report called to [facility], Ty informed of pts departure condition.

## 2018-07-13 NOTE — ED Provider Notes (Signed)
[facility] Emergency Department Provider Note  ____________________________________________   First MD Initiated Contact with Patient 07/13/18 1428     (approximate)  I have reviewed the triage vital signs and the nursing notes.   HISTORY  Chief Complaint Altered Mental Status  Level 5 exemption history limited by the patient's dementia  HPI Miranda Clay is a 83 y.o. female who comes to the emergency department by EMS from her nursing home after she was apparently found to have worsening altered mental status today.  She has a long-standing history of dementia however apparently she was somewhat "ataxic" today.  She had a normal blood sugar in route.  She was saturating 88% in route and placed on nasal cannula.  EMS gave 1 mg of Narcan for unclear reasons and it seemed to have no effect.  The patient herself has no complaints.    Past Medical History:  Diagnosis Date  . Dementia   . Diabetes mellitus without complication ([place])   . Hyperlipidemia   . Hypothyroidism     There are no active problems to display for this patient.   History reviewed. No pertinent surgical history.  Prior to Admission medications   Medication Sig Start Date End Date Taking? Authorizing Provider  aspirin EC 81 MG tablet Take 81 mg by mouth daily.   Yes [provider]  Cholecalciferol (VITAMIN D) 2000 UNITS tablet Take 2,000 Units by mouth daily.   Yes [provider]  glipiZIDE (GLUCOTROL) 10 MG tablet Take 10 mg by mouth 2 (two) times daily.   Yes [provider]  levothyroxine (SYNTHROID, LEVOTHROID) 25 MCG tablet Take 25 mcg by mouth daily before breakfast.   Yes [provider]  memantine (NAMENDA) 10 MG tablet Take 10 mg by mouth 2 (two) times daily.   Yes [provider]  metFORMIN (GLUCOPHAGE) 1000 MG tablet Take 1,000 mg by mouth 2 (two) times daily.    Yes [provider]  PARoxetine (PAXIL) 10 MG tablet Take 10  mg by mouth daily.   Yes [provider]  pravastatin (PRAVACHOL) 20 MG tablet Take 20 mg by mouth at bedtime.    Yes [provider]  Rivastigmine 13.3 MG/24HR PT24 Place 1 patch onto the skin daily.   Yes [provider]  traZODone (DESYREL) 50 MG tablet Take 25 mg by mouth 2 (two) times daily as needed (for agitation). In the afternoon between 2-4 pm and at bedtime   Yes [provider]  verapamil (CALAN-SR) 180 MG CR tablet Take 180 mg by mouth daily.   Yes [provider]    Allergies Patient has no known allergies.  History reviewed. No pertinent family history.  Social History Social History   Tobacco Use  . Smoking status: Former Research scientist (life sciences)  . Smokeless tobacco: Never Used  Substance Use Topics  . Alcohol use: No  . Drug use: No    Review of Systems Level 5 exemption history limited by the patient's dementia ____________________________________________   PHYSICAL EXAM:  VITAL SIGNS: ED Triage Vitals  Enc Vitals Group     BP      Pulse      Resp      Temp      Temp src      SpO2      Weight      Height      Head Circumference      Peak Flow      Pain Score  Pain Loc      Pain Edu?      Excl. in [place]?     Constitutional: Pleasant and cooperative severe dementia Eyes: PERRL EOMI. midrange and brisk Head: Atraumatic. Nose: No congestion/rhinnorhea. Mouth/Throat: No trismus Neck: No stridor.  No midline tenderness Cardiovascular: Normal rate, regular rhythm. Grossly normal heart sounds.  Good peripheral circulation. Respiratory: Normal respiratory effort.  No retractions. Lungs CTAB and moving good air Gastrointestinal: Soft nontender Musculoskeletal: No lower extremity edema   Neurologic:  . No gross focal neurologic deficits are appreciated. Skin:  Skin is warm, dry and intact. No rash noted. Psychiatric: Profound dementia  ____________________________________________   DIFFERENTIAL includes but not  limited to  Intracerebral hemorrhage, stroke, urinary tract infection, metabolic derangement ____________________________________________   LABS (all labs ordered are listed, but only abnormal results are displayed)  Labs Reviewed  URINALYSIS, COMPLETE (UACMP) WITH MICROSCOPIC - Abnormal; Notable for the following components:      Result Value   Color, Urine AMBER (*)    APPearance HAZY (*)    Glucose, UA >=500 (*)    Ketones, ur 20 (*)    Protein, ur 30 (*)    All other components within normal limits  COMPREHENSIVE METABOLIC PANEL - Abnormal; Notable for the following components:   Glucose, Bld 285 (*)    Total Protein 6.4 (*)    Albumin 3.2 (*)    All other components within normal limits  CBC WITH DIFFERENTIAL/PLATELET - Abnormal; Notable for the following components:   Neutro Abs 6.7 (*)    Monocytes Absolute 1.3 (*)    All other components within normal limits  TROPONIN I  TSH  T4, FREE  AMMONIA    Lab work reviewed by me consistent with chronically poor nutrition otherwise unremarkable __________________________________________  EKG  ED ECG REPORT I, Darel Hong, the attending physician, personally viewed and interpreted this ECG.  Date: 07/15/2018 EKG Time:  Rate: 68 Rhythm: normal sinus rhythm QRS Axis: normal Intervals: normal ST/T Wave abnormalities: normal Narrative Interpretation: no evidence of acute ischemia  ____________________________________________  RADIOLOGY  CT scan of the head reviewed by me shows 2.5 cm meningioma at the left CPA ____________________________________________   PROCEDURES  Procedure(s) performed: no  Procedures  Critical Care performed: no  ____________________________________________   INITIAL IMPRESSION / ASSESSMENT AND PLAN / ED COURSE  Pertinent labs & imaging results that were available during my care of the patient were reviewed by me and considered in my medical decision making (see chart for  details).   As part of my medical decision making, I reviewed the following data within the [place] History obtained from family if available, nursing notes, old chart and ekg, as well as notes from prior ED visits.       ----------------------------------------- 4:10 PM on 07/13/2018 -----------------------------------------  Patient CT scan shows a left-sided 2.5 mm CPA meningioma.  I spoke with [facility] neurosurgeon on call who indicated it is possible that this could be an etiology of the patient's ataxia.  I discussed with the patient and family member at bedside and we discussed that it is difficult to actually know if this meningioma is causing the symptoms as meningiomas tend to grow quite slowly and this is been known for some time.  Unfortunately I am unable to review the patient's previous records.  I offered the family inpatient admission versus outpatient management and the daughter would prefer for the patient to go home and be followed up with an MRI  as an outpatient which I think is entirely reasonable.  Strict return precautions have been given and the family verbalizes understanding agreement with the plan. ____________________________________________   FINAL CLINICAL IMPRESSION(S) / ED DIAGNOSES  Final diagnoses:  Ataxia  Altered mental status, unspecified altered mental status type      NEW MEDICATIONS STARTED DURING THIS VISIT:  Discharge Medication List as of 07/13/2018  4:12 PM       Note:  This document was prepared using Dragon voice recognition software and may include unintentional dictation errors.     Darel Hong, MD 07/15/18 [phone]

## 2018-07-13 NOTE — ED Notes (Signed)
Daughter at bedside.

## 2018-07-13 NOTE — ED Notes (Signed)
Pt cleaned up and new pants placed.

## 2018-07-13 NOTE — Discharge Instructions (Signed)
Today Miranda Clay's CT showed a 2.5cm meningioma at the left CPA.  This would likely benefit with further evaluation by an MRI and possible neurosurgery consultation.  Please know you are more than welcome to return to the ED at any point for any concerns whatsoever.  It was a pleasure to take care of you today, and thank you for coming to our emergency department.  If you have any questions or concerns before leaving please ask the nurse to grab me and I'm more than happy to go through your aftercare instructions again.  If you were prescribed any opioid pain medication today such as Norco, Vicodin, Percocet, morphine, hydrocodone, or oxycodone please make sure you do not drive when you are taking this medication as it can alter your ability to drive safely.  If you have any concerns once you are home that you are not improving or are in fact getting worse before you can make it to your follow-up appointment, please do not hesitate to call 911 and come back for further evaluation.  Darel Hong, MD  Results for orders placed or performed during the hospital encounter of 07/13/18  Urinalysis, Complete w Microscopic  Result Value Ref Range   Color, Urine AMBER (A) YELLOW   APPearance HAZY (A) CLEAR   Specific Gravity, Urine 1.026 1.005 - 1.030   pH 5.0 5.0 - 8.0   Glucose, UA >=500 (A) NEGATIVE mg/dL   Hgb urine dipstick NEGATIVE NEGATIVE   Bilirubin Urine NEGATIVE NEGATIVE   Ketones, ur 20 (A) NEGATIVE mg/dL   Protein, ur 30 (A) NEGATIVE mg/dL   Nitrite NEGATIVE NEGATIVE   Leukocytes, UA NEGATIVE NEGATIVE   RBC / HPF 6-10 0 - 5 RBC/hpf   WBC, UA 0-5 0 - 5 WBC/hpf   Bacteria, UA NONE SEEN NONE SEEN   Squamous Epithelial / LPF 0-5 0 - 5   Mucus PRESENT   Comprehensive metabolic panel  Result Value Ref Range   Sodium 140 135 - 145 mmol/L   Potassium 3.8 3.5 - 5.1 mmol/L   Chloride 106 98 - 111 mmol/L   CO2 27 22 - 32 mmol/L   Glucose, Bld 285 (H) 70 - 99 mg/dL   BUN 23 8 - 23 mg/dL     Creatinine, Ser 0.62 0.44 - 1.00 mg/dL   Calcium 9.1 8.9 - 10.3 mg/dL   Total Protein 6.4 (L) 6.5 - 8.1 g/dL   Albumin 3.2 (L) 3.5 - 5.0 g/dL   AST 16 15 - 41 U/L   ALT 9 0 - 44 U/L   Alkaline Phosphatase 52 38 - 126 U/L   Total Bilirubin 0.4 0.3 - 1.2 mg/dL   GFR calc non Af Amer >60 >60 mL/min   GFR calc Af Amer >60 >60 mL/min   Anion gap 7 5 - 15  CBC with Differential  Result Value Ref Range   WBC 10.3 3.6 - 11.0 K/uL   RBC 4.01 3.80 - 5.20 MIL/uL   Hemoglobin 12.6 12.0 - 16.0 g/dL   HCT 37.4 35.0 - 47.0 %   MCV 93.2 80.0 - 100.0 fL   MCH 31.3 26.0 - 34.0 pg   MCHC 33.6 32.0 - 36.0 g/dL   RDW 13.9 11.5 - 14.5 %   Platelets 318 150 - 440 K/uL   Neutrophils Relative % 65 %   Neutro Abs 6.7 (H) 1.4 - 6.5 K/uL   Lymphocytes Relative 18 %   Lymphs Abs 1.9 1.0 - 3.6 K/uL   Monocytes  Relative 13 %   Monocytes Absolute 1.3 (H) 0.2 - 0.9 K/uL   Eosinophils Relative 3 %   Eosinophils Absolute 0.3 0 - 0.7 K/uL   Basophils Relative 1 %   Basophils Absolute 0.1 0 - 0.1 K/uL  Troponin I  Result Value Ref Range   Troponin I <0.03 <0.03 ng/mL  TSH  Result Value Ref Range   TSH 2.587 0.350 - 4.500 uIU/mL  T4, free  Result Value Ref Range   Free T4 1.11 0.82 - 1.77 ng/dL  Ammonia  Result Value Ref Range   Ammonia 18 9 - 35 umol/L   Ct Head Wo Contrast  Result Date: 07/13/2018 CLINICAL DATA:  Altered mental status. EXAM: CT HEAD WITHOUT CONTRAST TECHNIQUE: Contiguous axial images were obtained from the base of the skull through the vertex without intravenous contrast. COMPARISON:  None. FINDINGS: Brain: No evidence of acute infarction, hemorrhage, hydrocephalus, extra-axial collection or mass effect. There is a 2.5 cm partially calcified mass in the left cerebellopontine angle. Moderate generalized cerebral atrophy. Vascular: No hyperdense vessel. Skull: Negative for fracture or focal lesion. Sinuses/Orbits: No acute finding. Other: None. IMPRESSION: 1.  No acute intracranial  abnormality. 2. 2.5 cm partially calcified mass in the left cerebellopontine angle, likely a meningioma. Electronically Signed   By: Titus Dubin M.D.   On: 07/13/2018 15:16

## 2018-07-19 ENCOUNTER — Other Ambulatory Visit: Payer: Self-pay | Admitting: Diagnostic Radiology

## 2018-07-19 DIAGNOSIS — R930 Abnormal findings on diagnostic imaging of skull and head, not elsewhere classified: Secondary | ICD-10-CM

## 2018-07-24 ENCOUNTER — Ambulatory Visit
Admission: RE | Admit: 2018-07-24 | Discharge: 2018-07-24 | Disposition: A | Discharge status: Home / Self Care | Payer: Medicare Other | Source: Ambulatory Visit | Attending: Diagnostic Radiology | Admitting: Diagnostic Radiology

## 2018-07-24 DIAGNOSIS — I7389 Other specified peripheral vascular diseases: Secondary | ICD-10-CM | POA: Diagnosis not present

## 2018-07-24 DIAGNOSIS — D320 Benign neoplasm of cerebral meninges: Secondary | ICD-10-CM | POA: Diagnosis not present

## 2018-07-24 DIAGNOSIS — R930 Abnormal findings on diagnostic imaging of skull and head, not elsewhere classified: Secondary | ICD-10-CM | POA: Diagnosis not present

## 2018-07-24 DIAGNOSIS — J320 Chronic maxillary sinusitis: Secondary | ICD-10-CM | POA: Diagnosis not present

## 2018-07-24 MED ORDER — GADOBENATE DIMEGLUMINE 529 MG/ML IV SOLN
10.0000 mL | Freq: Once | INTRAVENOUS | Status: AC | PRN
  Administered 2018-07-24: 10 mL via INTRAVENOUS

## 2018-08-02 ENCOUNTER — Ambulatory Visit: Payer: Medicare Other

## 2020-01-07 ENCOUNTER — Emergency Department
Admission: EM | Admit: 2020-01-07 | Discharge: 2020-01-08 | Disposition: A | Discharge status: Home / Self Care | Payer: Medicare Other | Source: Home / Self Care | Attending: Student in an Organized Health Care Education/Training Program | Admitting: Student in an Organized Health Care Education/Training Program

## 2020-01-07 ENCOUNTER — Emergency Department
Admission: EM | Admit: 2020-01-07 | Discharge: 2020-01-07 | Disposition: A | Discharge status: Skilled Nursing Facility | Payer: Medicare Other | Attending: Student | Admitting: Student

## 2020-01-07 ENCOUNTER — Encounter: Payer: Self-pay | Admitting: Emergency Medicine

## 2020-01-07 ENCOUNTER — Other Ambulatory Visit: Payer: Self-pay

## 2020-01-07 ENCOUNTER — Emergency Department: Payer: Medicare Other

## 2020-01-07 DIAGNOSIS — Z79899 Other long term (current) drug therapy: Secondary | ICD-10-CM | POA: Diagnosis not present

## 2020-01-07 DIAGNOSIS — W19XXXA Unspecified fall, initial encounter: Secondary | ICD-10-CM

## 2020-01-07 DIAGNOSIS — Y999 Unspecified external cause status: Secondary | ICD-10-CM | POA: Diagnosis not present

## 2020-01-07 DIAGNOSIS — Z79890 Hormone replacement therapy (postmenopausal): Secondary | ICD-10-CM | POA: Diagnosis not present

## 2020-01-07 DIAGNOSIS — Z7982 Long term (current) use of aspirin: Secondary | ICD-10-CM | POA: Insufficient documentation

## 2020-01-07 DIAGNOSIS — R296 Repeated falls: Secondary | ICD-10-CM | POA: Insufficient documentation

## 2020-01-07 DIAGNOSIS — E785 Hyperlipidemia, unspecified: Secondary | ICD-10-CM | POA: Insufficient documentation

## 2020-01-07 DIAGNOSIS — Z87891 Personal history of nicotine dependence: Secondary | ICD-10-CM | POA: Insufficient documentation

## 2020-01-07 DIAGNOSIS — S0993XA Unspecified injury of face, initial encounter: Secondary | ICD-10-CM | POA: Diagnosis present

## 2020-01-07 DIAGNOSIS — I10 Essential (primary) hypertension: Secondary | ICD-10-CM | POA: Insufficient documentation

## 2020-01-07 DIAGNOSIS — Y929 Unspecified place or not applicable: Secondary | ICD-10-CM | POA: Diagnosis not present

## 2020-01-07 DIAGNOSIS — Z7984 Long term (current) use of oral hypoglycemic drugs: Secondary | ICD-10-CM | POA: Insufficient documentation

## 2020-01-07 DIAGNOSIS — S0081XA Abrasion of other part of head, initial encounter: Secondary | ICD-10-CM | POA: Diagnosis not present

## 2020-01-07 DIAGNOSIS — R0789 Other chest pain: Secondary | ICD-10-CM | POA: Diagnosis not present

## 2020-01-07 DIAGNOSIS — F0390 Unspecified dementia without behavioral disturbance: Secondary | ICD-10-CM | POA: Insufficient documentation

## 2020-01-07 DIAGNOSIS — E119 Type 2 diabetes mellitus without complications: Secondary | ICD-10-CM | POA: Insufficient documentation

## 2020-01-07 DIAGNOSIS — Z885 Allergy status to narcotic agent status: Secondary | ICD-10-CM | POA: Diagnosis not present

## 2020-01-07 DIAGNOSIS — Z043 Encounter for examination and observation following other accident: Secondary | ICD-10-CM | POA: Insufficient documentation

## 2020-01-07 DIAGNOSIS — S0083XA Contusion of other part of head, initial encounter: Secondary | ICD-10-CM | POA: Diagnosis not present

## 2020-01-07 DIAGNOSIS — E039 Hypothyroidism, unspecified: Secondary | ICD-10-CM | POA: Diagnosis not present

## 2020-01-07 DIAGNOSIS — W010XXA Fall on same level from slipping, tripping and stumbling without subsequent striking against object, initial encounter: Secondary | ICD-10-CM | POA: Insufficient documentation

## 2020-01-07 HISTORY — DX: Essential (primary) hypertension: I10

## 2020-01-07 LAB — CBC WITH DIFFERENTIAL/PLATELET
Abs Immature Granulocytes: 0.04 10*3/uL (ref 0.00–0.07)
Basophils Absolute: 0.1 10*3/uL (ref 0.0–0.1)
Basophils Relative: 1 %
Eosinophils Absolute: 0.6 10*3/uL — ABNORMAL HIGH (ref 0.0–0.5)
Eosinophils Relative: 6 %
HCT: 39.2 % (ref 36.0–46.0)
Hemoglobin: 12.9 g/dL (ref 12.0–15.0)
Immature Granulocytes: 0 %
Lymphocytes Relative: 28 %
Lymphs Abs: 2.6 10*3/uL (ref 0.7–4.0)
MCH: 31.3 pg (ref 26.0–34.0)
MCHC: 32.9 g/dL (ref 30.0–36.0)
MCV: 95.1 fL (ref 80.0–100.0)
Monocytes Absolute: 0.9 10*3/uL (ref 0.1–1.0)
Monocytes Relative: 10 %
Neutro Abs: 5.1 10*3/uL (ref 1.7–7.7)
Neutrophils Relative %: 55 %
Platelets: 281 10*3/uL (ref 150–400)
RBC: 4.12 MIL/uL (ref 3.87–5.11)
RDW: 13.3 % (ref 11.5–15.5)
WBC: 9.3 10*3/uL (ref 4.0–10.5)
nRBC: 0 % (ref 0.0–0.2)

## 2020-01-07 LAB — URINALYSIS, COMPLETE (UACMP) WITH MICROSCOPIC
Bilirubin Urine: NEGATIVE
Glucose, UA: >=500 mg/dL — AB
Hgb urine dipstick: NEGATIVE
Ketones, ur: NEGATIVE mg/dL
Leukocytes,Ua: NEGATIVE
Nitrite: NEGATIVE
Protein, ur: NEGATIVE mg/dL
Specific Gravity, Urine: 1.02 (ref 1.005–1.030)
pH: 6 (ref 5.0–8.0)

## 2020-01-07 LAB — COMPREHENSIVE METABOLIC PANEL
ALT: 12 U/L (ref 0–44)
AST: 17 U/L (ref 15–41)
Albumin: 3.6 g/dL (ref 3.5–5.0)
Alkaline Phosphatase: 53 U/L (ref 38–126)
Anion gap: 11 (ref 5–15)
BUN: 22 mg/dL (ref 8–23)
CO2: 29 mmol/L (ref 22–32)
Calcium: 9.7 mg/dL (ref 8.9–10.3)
Chloride: 102 mmol/L (ref 98–111)
Creatinine, Ser: 0.63 mg/dL (ref 0.44–1.00)
GFR calc Af Amer: >60 mL/min (ref >60)
GFR calc non Af Amer: >60 mL/min (ref >60)
Glucose, Bld: 274 mg/dL — ABNORMAL HIGH (ref 70–99)
Potassium: 3.4 mmol/L — ABNORMAL LOW (ref 3.5–5.1)
Sodium: 142 mmol/L (ref 135–145)
Total Bilirubin: 0.4 mg/dL (ref 0.3–1.2)
Total Protein: 6.6 g/dL (ref 6.5–8.1)

## 2020-01-07 LAB — LIPASE, BLOOD: Lipase: 22 U/L (ref 11–51)

## 2020-01-07 MED ORDER — RIVASTIGMINE 13.3 MG/24HR TD PT24: 13.3000 mg | MEDICATED_PATCH | Freq: Every day | TRANSDERMAL | Status: DC

## 2020-01-07 MED ORDER — ASPIRIN EC 81 MG PO TBEC
81.0000 mg | DELAYED_RELEASE_TABLET | Freq: Every day | ORAL | Status: DC
  Administered 2020-01-08: 09:00:00 81 mg via ORAL
  Filled 2020-01-07: qty 1

## 2020-01-07 MED ORDER — LEVOTHYROXINE SODIUM 50 MCG PO TABS
25.0000 ug | ORAL_TABLET | Freq: Every day | ORAL | Status: DC
  Administered 2020-01-08: 07:00:00 25 ug via ORAL
  Filled 2020-01-07: qty 1

## 2020-01-07 MED ORDER — METFORMIN HCL 500 MG PO TABS
1000.0000 mg | ORAL_TABLET | Freq: Two times a day (BID) | ORAL | Status: DC
  Administered 2020-01-08: 09:00:00 1000 mg via ORAL
  Filled 2020-01-07: qty 2

## 2020-01-07 MED ORDER — PAROXETINE HCL 10 MG PO TABS
10.0000 mg | ORAL_TABLET | Freq: Every day | ORAL | Status: DC
  Administered 2020-01-08: 10 mg via ORAL
  Filled 2020-01-07: qty 1

## 2020-01-07 MED ORDER — MEMANTINE HCL 5 MG PO TABS
10.0000 mg | ORAL_TABLET | Freq: Two times a day (BID) | ORAL | Status: DC
  Administered 2020-01-08: 09:00:00 10 mg via ORAL
  Filled 2020-01-07: qty 2

## 2020-01-07 MED ORDER — GLIPIZIDE 10 MG PO TABS
10.0000 mg | ORAL_TABLET | Freq: Two times a day (BID) | ORAL | Status: DC
  Administered 2020-01-08: 09:00:00 10 mg via ORAL
  Filled 2020-01-07 (×2): qty 1

## 2020-01-07 MED ORDER — ONDANSETRON HCL 4 MG/2ML IJ SOLN
4.0000 mg | Freq: Once | INTRAMUSCULAR | Status: AC
  Administered 2020-01-07: 21:00:00 4 mg via INTRAVENOUS
  Filled 2020-01-07: qty 2

## 2020-01-07 MED ORDER — VITAMIN D 25 MCG (1000 UNIT) PO TABS
2000.0000 [IU] | ORAL_TABLET | Freq: Every day | ORAL | Status: DC
  Administered 2020-01-08: 09:00:00 2000 [IU] via ORAL
  Filled 2020-01-07: qty 2

## 2020-01-07 MED ORDER — VERAPAMIL HCL ER 180 MG PO TBCR
180.0000 mg | EXTENDED_RELEASE_TABLET | Freq: Every day | ORAL | Status: DC
  Administered 2020-01-08: 09:00:00 180 mg via ORAL
  Filled 2020-01-07: qty 1

## 2020-01-07 MED ORDER — PRAVASTATIN SODIUM 20 MG PO TABS
20.0000 mg | ORAL_TABLET | Freq: Every day | ORAL | Status: DC
  Filled 2020-01-07 (×2): qty 1

## 2020-01-07 MED ORDER — LISINOPRIL 5 MG PO TABS
5.0000 mg | ORAL_TABLET | Freq: Every day | ORAL | Status: DC
  Administered 2020-01-08: 09:00:00 5 mg via ORAL
  Filled 2020-01-07: qty 1

## 2020-01-07 NOTE — ED Notes (Addendum)
Pt vomited large amount emesis; pt clensed, undressed and assisted into gown, yellow socks and bracelet; card monitor in place with bed alarm

## 2020-01-07 NOTE — ED Provider Notes (Signed)
CT head/CS/face:  IMPRESSION:  1. No evidence of acute intracranial injury. Negative for facial or  cervical spine fracture.  2. Right facial hematoma without foreign body.  3. Brain atrophy in keeping with history of dementia.  4. Known 2.4 cm left CP angle meningioma.   CT chest: IMPRESSION:  No acute or posttraumatic finding.   Imaging as above, no acute traumatic injuries.  As such, patient is stable for discharge with outpatient follow-up.   Lilia Pro., MD 01/07/20 [phone]

## 2020-01-07 NOTE — ED Notes (Signed)
Pt to CT via stretcher accomp by CT tech 

## 2020-01-07 NOTE — ED Notes (Signed)
Daughter now in room; st was unaware of any additional falls today

## 2020-01-07 NOTE — ED Notes (Signed)
ED Provider at bedside. 

## 2020-01-07 NOTE — ED Notes (Addendum)
have again tried several more times to contact [facility] with no answer; MD and pt's daughter aware

## 2020-01-07 NOTE — ED Notes (Signed)
have again tried several more times to contact [facility] with no answer; MD and pt's daughter aware

## 2020-01-07 NOTE — ED Notes (Addendum)
Pt returns to room; have again tried several more times to contact [facility] with no answer; MD and pt's daughter aware

## 2020-01-07 NOTE — ED Notes (Signed)
[facility]  CALLED   FOR  TRANSPORT  TO [facility] 

## 2020-01-07 NOTE — ED Triage Notes (Addendum)
Pt arrived from [facility] via [place].  Staff at [facility] states she had an unwitnessed fall, they found her laying in floor.  She takes aspirin. Staff told EMS that she has a hx of dementia, and that she is non-ambulatory, although she will continually try to get up and will fall every time.    EMS:  BP 172/84 upon arrival, other vitals unremarkable.  Upon arrival, hematoma noted over right eye.  Pt is alert and oriented to self only.  Pt indicates that she is having right-sided torso pain.

## 2020-01-07 NOTE — ED Notes (Signed)
Spoke with pt's daughter and notified of pt's arrival; daughter is enroute

## 2020-01-07 NOTE — ED Provider Notes (Signed)
[facility] Emergency Department Provider Note  ____________________________________________   First MD Initiated Contact with Patient 01/07/20 [phone]     (approximate)  I have reviewed the triage vital signs and the nursing notes.   HISTORY  Chief Complaint Fall  Level 5 caveat:  history/ROS limited by chronic dementia  HPI Miranda Clay is a 85 y.o. female who reportedly is nonambulatory with dementia but forgets that she is unable to walk and falls frequently.  Reportedly tonight she had an unwitnessed fall with a contusion to the right side of her face.  The patient is in no distress.  She said that it hurts a little bit and touch to the side of her face at the site hematoma.  Initially she denied any additional pain but then she said that it hurt up underneath her right breast.  She denies shortness of breath.  She denies headache and neck pain.  She denies abdominal pain and nausea.  She does not remember what happened which is reportedly her baseline.         Past Medical History:  Diagnosis Date  . Dementia ([place])   . Diabetes mellitus without complication ([place])   . Hyperlipidemia   . Hypertension   . Hypothyroidism     There are no problems to display for this patient.   History reviewed. No pertinent surgical history.  Prior to Admission medications   Medication Sig Start Date End Date Taking? Authorizing Provider  aspirin EC 81 MG tablet Take 81 mg by mouth daily.    [provider]  Cholecalciferol (VITAMIN D) 2000 UNITS tablet Take 2,000 Units by mouth daily.    [provider]  glipiZIDE (GLUCOTROL) 10 MG tablet Take 10 mg by mouth 2 (two) times daily.    [provider]  levothyroxine (SYNTHROID, LEVOTHROID) 25 MCG tablet Take 25 mcg by mouth daily before breakfast.    [provider]  memantine (NAMENDA) 10 MG tablet Take 10 mg by mouth 2 (two) times daily.    [provider]  metFORMIN  (GLUCOPHAGE) 1000 MG tablet Take 1,000 mg by mouth 2 (two) times daily.     [provider]  PARoxetine (PAXIL) 10 MG tablet Take 10 mg by mouth daily.    [provider]  pravastatin (PRAVACHOL) 20 MG tablet Take 20 mg by mouth at bedtime.     [provider]  Rivastigmine 13.3 MG/24HR PT24 Place 1 patch onto the skin daily.    [provider]  traZODone (DESYREL) 50 MG tablet Take 25 mg by mouth 2 (two) times daily as needed (for agitation). In the afternoon between 2-4 pm and at bedtime    [provider]  verapamil (CALAN-SR) 180 MG CR tablet Take 180 mg by mouth daily.    [provider]    Allergies Patient has no known allergies.  History reviewed. No pertinent family history.  Social History Social History   Tobacco Use  . Smoking status: Former Research scientist (life sciences)  . Smokeless tobacco: Never Used  Substance Use Topics  . Alcohol use: No  . Drug use: No    Review of Systems Level 5 caveat:  history/ROS limited by chronic dementia    ____________________________________________   PHYSICAL EXAM:  VITAL SIGNS: ED Triage Vitals  Enc Vitals Group     BP 01/07/20 0624 (!) 153/90     Pulse Rate 01/07/20 0616 63     Resp 01/07/20 0624 18     Temp  01/07/20 0616 98.4 F (36.9 C)     Temp Source 01/07/20 0616 Oral     SpO2 01/07/20 0616 94 %     Weight 01/07/20 0618 54.4 kg (120 lb)     Height 01/07/20 0618 1.6 m (5\' 3" )     Head Circumference --      Peak Flow --      Pain Score 01/07/20 0618 0     Pain Loc --      Pain Edu? --      Excl. in [place]? --     Constitutional: Awake and alert, able to answer simple questions about her symptoms but unable to provide any history.  No distress at this time. Eyes: Conjunctivae are normal except for what may be a small subconjunctival hemorrhage in the right eye.  Pupils are equal and reactive. Head: Moderate sized contusion with hematoma and abrasion to the right side of her head just  posterior to the right eye.  No laceration requiring repair.  Tender to the touch. Nose: No congestion/rhinnorhea. Mouth/Throat: Patient is wearing a mask. Neck: No stridor.  No meningeal signs.   Cardiovascular: Normal rate, regular rhythm. Good peripheral circulation. Grossly normal heart sounds. Respiratory: Normal respiratory effort.  No retractions. Gastrointestinal: Soft and nontender. No distention.  Musculoskeletal: No tenderness to palpation of the cervical spine and she does not report any pain when flexing and extending her head nor turning it side to side.  Right-sided chest wall tenderness to palpation without tenderness on the left side of the chest wall.  It involves the superior anterior chest wall as well as more lateral and beneath the right breast.  No lower extremity tenderness nor edema. No gross deformities of extremities. Neurologic:  Normal speech and language. No gross focal neurologic deficits are appreciated.  Skin:  Skin is warm, dry and intact.  No obvious contusion except for the contusion on the right side of her face. Psychiatric: Mood and affect are normal. Speech and behavior are normal.  ____________________________________________   LABS (all labs ordered are listed, but only abnormal results are displayed)  Labs Reviewed - No data to display ____________________________________________  EKG  No indication for EKG ____________________________________________  RADIOLOGY I, Hinda Kehr, personally viewed and evaluated these images (plain radiographs) as part of my medical decision making, as well as reviewing the written report by the radiologist.  ED MD interpretation:  CT scans pending at time of sign out  Official radiology report(s): No results found.  ____________________________________________   PROCEDURES   Procedure(s) performed (including Critical Care):  Procedures   ____________________________________________   INITIAL  IMPRESSION / MDM / ASSESSMENT AND PLAN / ED COURSE  As part of my medical decision making, I reviewed the following data within the [place] notes reviewed and incorporated, Old chart reviewed, Patient signed out to  and Notes from prior ED visits   Differential diagnosis includes, but is not limited to, mechanical fall, intracranial bleeding, cervical spine injury, facial fracture, contusion, abrasion, rib fracture, rib contusion.  The patient reported some pain and tenderness in the right side of her chest I thought initially that perhaps she was responding simply to the exam itself, but she does not flinch or report tenderness when I palpate the left side of her chest and she does not report any pain to palpation of her abdomen.  However she does report tenderness when I palpate all throughout the right side of her chest wall.  This may  be indicative of contusion or musculoskeletal strain.  Regardless, given her age and comorbidities and inability to provide a history, I will evaluate with a CT scan of the chest without contrast in addition to the anticipated scans of her head, maxillofacial, and cervical spine.  At this point I do not believe there is any indication for lab work or urinalysis given history provided and her reassuring vital signs and presentation at this time.      Clinical Course as of Jan 06 713  Tue Jan 07, 2020  0629 Transferring ED care to Dr. Joan Mayans to follow up CT scans.   [CF]    Clinical Course User Index [CF] Hinda Kehr, MD     ____________________________________________  FINAL CLINICAL IMPRESSION(S) / ED DIAGNOSES  Final diagnoses:  Fall, initial encounter  Contusion of face, initial encounter  Abrasion of face, initial encounter     MEDICATIONS GIVEN DURING THIS VISIT:  Medications - No data to display   ED Discharge Orders    None      *Please note:  Jaimelynn Dimmick was evaluated in Emergency Department on 01/07/2020 for  the symptoms described in the history of present illness. She was evaluated in the context of the global COVID-19 pandemic, which necessitated consideration that the patient might be at risk for infection with the SARS-CoV-2 virus that causes COVID-19. Institutional protocols and algorithms that pertain to the evaluation of patients at risk for COVID-19 are in a state of rapid change based on information released by regulatory bodies including the CDC and federal and state organizations. These policies and algorithms were followed during the patient's care in the ED.  Some ED evaluations and interventions may be delayed as a result of limited staffing during the pandemic.*  Note:  This document was prepared using Dragon voice recognition software and may include unintentional dictation errors.   Hinda Kehr, MD 01/07/20 [phone]

## 2020-01-07 NOTE — ED Triage Notes (Addendum)
Pt to room 3 via EMS from [facility] for reported 2 witnessed falls today; no LOC; EMS reports staff st pt seemed dizzy and c/o nausea

## 2020-01-07 NOTE — ED Notes (Signed)
Pt with bruising to rt temple/rt eye with small scabbed lac to outer edge of eyelid; area of redness to rt upper arm; no c/o or grimacing noted with palpation of all extremities, chest, abd, neck, back, face & head; pt MAEW; pt is alert to name only and is asking for her mother; per pt chart, pt seen early this am for fall and was d/c back to facility; unsure if this fall reported by EMS if original fall or subsequent fall; have attempted to call [facility] for further info several times with no answer

## 2020-01-07 NOTE — ED Provider Notes (Signed)
[facility] Emergency Department Provider Note    First MD Initiated Contact with Patient 01/07/20 2044     (approximate)  I have reviewed the triage vital signs and the nursing notes.   HISTORY  Chief Complaint Fall  Level V Caveat:  dementia  HPI Miranda Clay is a 85 y.o. female presents to the ER from [place] after multiple falls.  Was actually seen this morning for similar symptoms.  Patient does have dementia and is unable to provide much additional history.  She does not appear to be in any discomfort but did have episode of emesis upon arrival.  Staff did not provide any additional history.  We will try to reach staff or family to get additional history as to what brought her back to the ER.    Past Medical History:  Diagnosis Date  . Dementia ([place])   . Diabetes mellitus without complication ([place])   . Hyperlipidemia   . Hypertension   . Hypothyroidism    No family history on file. History reviewed. No pertinent surgical history. There are no problems to display for this patient.     Prior to Admission medications   Medication Sig Start Date End Date Taking? Authorizing Provider  aspirin EC 81 MG tablet Take 81 mg by mouth daily.   Yes [provider]  Cholecalciferol (VITAMIN D) 2000 UNITS tablet Take 2,000 Units by mouth daily.   Yes [provider]  glipiZIDE (GLUCOTROL) 10 MG tablet Take 20 mg by mouth 2 (two) times daily.    Yes [provider]  levothyroxine (SYNTHROID, LEVOTHROID) 25 MCG tablet Take 25 mcg by mouth daily before breakfast.   Yes [provider]  lisinopril (ZESTRIL) 5 MG tablet Take 5 mg by mouth daily.   Yes [provider]  memantine (NAMENDA) 10 MG tablet Take 10 mg by mouth 2 (two) times daily.   Yes [provider]  metFORMIN (GLUCOPHAGE) 1000 MG tablet Take 1,000 mg by mouth 2 (two) times daily.    Yes [provider]  PARoxetine (PAXIL) 10 MG tablet  Take 10 mg by mouth daily.   Yes [provider]  pravastatin (PRAVACHOL) 20 MG tablet Take 20 mg by mouth at bedtime.    Yes [provider]  Rivastigmine 13.3 MG/24HR PT24 Place 1 patch onto the skin daily.   Yes [provider]  verapamil (CALAN-SR) 180 MG CR tablet Take 180 mg by mouth daily.   Yes [provider]  traZODone (DESYREL) 50 MG tablet Take 25 mg by mouth 2 (two) times daily as needed (for agitation). In the afternoon between 2-4 pm and at bedtime    [provider]    Allergies Codeine    Social History Social History   Tobacco Use  . Smoking status: Former Research scientist (life sciences)  . Smokeless tobacco: Never Used  Substance Use Topics  . Alcohol use: No  . Drug use: No    Review of Systems Patient denies headaches, rhinorrhea, blurry vision, numbness, shortness of breath, chest pain, edema, cough, abdominal pain, nausea, vomiting, diarrhea, dysuria, fevers, rashes or hallucinations unless otherwise stated above in HPI. ____________________________________________   PHYSICAL EXAM:  VITAL SIGNS: Vitals:   01/07/20 2200 01/07/20 2300  BP: (!) 173/80 (!) 165/74  Pulse: 73 73  Resp: 16 17  Temp:    SpO2: 98% 94%    Constitutional: Alert non toxic appearing, calm and pleasant Eyes: Conjunctivae are normal.  Head: right forehead contusion  and bruising Nose: No congestion/rhinnorhea. Mouth/Throat: Mucous membranes are moist.   Neck: No stridor. Painless ROM.  Cardiovascular: Normal rate, regular rhythm. Grossly normal heart sounds.  Good peripheral circulation. Respiratory: Normal respiratory effort.  No retractions. Lungs CTAB. Gastrointestinal: Soft and nontender in all four quadrants. No distention. No abdominal bruits. No CVA tenderness. Genitourinary:  Musculoskeletal: No lower extremity tenderness nor edema.  No joint effusions. Neurologic:  Normal speech and language. No gross focal neurologic deficits are appreciated. No  facial droop Skin:  Skin is warm, dry and intact. No rash noted. Psychiatric: pleasant and cooperative. ____________________________________________   LABS (all labs ordered are listed, but only abnormal results are displayed)  Results for orders placed or performed during the hospital encounter of 01/07/20 (from the past 24 hour(s))  CBC with Differential/Platelet     Status: Abnormal   Collection Time: 01/07/20  8:41 PM  Result Value Ref Range   WBC 9.3 4.0 - 10.5 K/uL   RBC 4.12 3.87 - 5.11 MIL/uL   Hemoglobin 12.9 12.0 - 15.0 g/dL   HCT 39.2 36.0 - 46.0 %   MCV 95.1 80.0 - 100.0 fL   MCH 31.3 26.0 - 34.0 pg   MCHC 32.9 30.0 - 36.0 g/dL   RDW 13.3 11.5 - 15.5 %   Platelets 281 150 - 400 K/uL   nRBC 0.0 0.0 - 0.2 %   Neutrophils Relative % 55 %   Neutro Abs 5.1 1.7 - 7.7 K/uL   Lymphocytes Relative 28 %   Lymphs Abs 2.6 0.7 - 4.0 K/uL   Monocytes Relative 10 %   Monocytes Absolute 0.9 0.1 - 1.0 K/uL   Eosinophils Relative 6 %   Eosinophils Absolute 0.6 (H) 0.0 - 0.5 K/uL   Basophils Relative 1 %   Basophils Absolute 0.1 0.0 - 0.1 K/uL   Immature Granulocytes 0 %   Abs Immature Granulocytes 0.04 0.00 - 0.07 K/uL  Comprehensive metabolic panel     Status: Abnormal   Collection Time: 01/07/20  8:41 PM  Result Value Ref Range   Sodium 142 135 - 145 mmol/L   Potassium 3.4 (L) 3.5 - 5.1 mmol/L   Chloride 102 98 - 111 mmol/L   CO2 29 22 - 32 mmol/L   Glucose, Bld 274 (H) 70 - 99 mg/dL   BUN 22 8 - 23 mg/dL   Creatinine, Ser 0.63 0.44 - 1.00 mg/dL   Calcium 9.7 8.9 - 10.3 mg/dL   Total Protein 6.6 6.5 - 8.1 g/dL   Albumin 3.6 3.5 - 5.0 g/dL   AST 17 15 - 41 U/L   ALT 12 0 - 44 U/L   Alkaline Phosphatase 53 38 - 126 U/L   Total Bilirubin 0.4 0.3 - 1.2 mg/dL   GFR calc non Af Amer >60 >60 mL/min   GFR calc Af Amer >60 >60 mL/min   Anion gap 11 5 - 15  Lipase, blood     Status: None   Collection Time: 01/07/20  8:41 PM  Result Value Ref Range   Lipase 22 11 - 51 U/L    Urinalysis, Complete w Microscopic     Status: Abnormal   Collection Time: 01/07/20 10:36 PM  Result Value Ref Range   Color, Urine YELLOW (A) YELLOW   APPearance HAZY (A) CLEAR   Specific Gravity, Urine 1.020 1.005 - 1.030   pH 6.0 5.0 - 8.0   Glucose, UA >=500 (A) NEGATIVE mg/dL   Hgb urine dipstick NEGATIVE NEGATIVE  Bilirubin Urine NEGATIVE NEGATIVE   Ketones, ur NEGATIVE NEGATIVE mg/dL   Protein, ur NEGATIVE NEGATIVE mg/dL   Nitrite NEGATIVE NEGATIVE   Leukocytes,Ua NEGATIVE NEGATIVE   RBC / HPF 0-5 0 - 5 RBC/hpf   WBC, UA 0-5 0 - 5 WBC/hpf   Bacteria, UA RARE (A) NONE SEEN   Squamous Epithelial / LPF 0-5 0 - 5   Mucus PRESENT    ____________________________________________  EKG My review and personal interpretation at Time: 20:35   Indication: screening  Rate: 60  Rhythm: sinus Axis: normal Other: normal intervals, nos temi ____________________________________________  RADIOLOGY  I personally reviewed all radiographic images ordered to evaluate for the above acute complaints and reviewed radiology reports and findings.  These findings were personally discussed with the patient.  Please see medical record for radiology report.  ____________________________________________   PROCEDURES  Procedure(s) performed:  Procedures    Critical Care performed: no ____________________________________________   INITIAL IMPRESSION / ASSESSMENT AND PLAN / ED COURSE  Pertinent labs & imaging results that were available during my care of the patient were reviewed by me and considered in my medical decision making (see chart for details).   DDX: Dehydration, sepsis, contusion, concussion, fracture, electrolyte abnormality, deconditioning  Miranda Clay is a 85 y.o. who presents to the ED from [facility].  Reason for presenting to the ER again is quite unclear.  Family was unable to get additional history or story from [facility].  EMS was unable to get much of a story either.   Patient had one episode of nausea but her abdominal exam is soft and benign.  Will repeat neuro imaging given her recent injury age and risk factors exclude interval development of intracranial abnormality.  Blood work was sent off for above differential.  Her work-up has been largely unremarkable and at baseline.  She does not appear to be in any acute distress.  Family is unwilling for her to go back to [facility] due to concern for her safety.  States her unable to take her home.  I will see indication for hospitalization at this time.  Will consult social work.     The patient was evaluated in Emergency Department today for the symptoms described in the history of present illness. He/she was evaluated in the context of the global COVID-19 pandemic, which necessitated consideration that the patient might be at risk for infection with the SARS-CoV-2 virus that causes COVID-19. Institutional protocols and algorithms that pertain to the evaluation of patients at risk for COVID-19 are in a state of rapid change based on information released by regulatory bodies including the CDC and federal and state organizations. These policies and algorithms were followed during the patient's care in the ED.  As part of my medical decision making, I reviewed the following data within the [place] notes reviewed and incorporated, Labs reviewed, notes from prior ED visits and [place] Controlled Substance Database   ____________________________________________   FINAL CLINICAL IMPRESSION(S) / ED DIAGNOSES  Final diagnoses:  Fall, initial encounter      NEW MEDICATIONS STARTED DURING THIS VISIT:  New Prescriptions   No medications on file     Note:  This document was prepared using Dragon voice recognition software and may include unintentional dictation errors.    Merlyn Lot, MD 01/07/20 2328

## 2020-01-08 DIAGNOSIS — S0083XA Contusion of other part of head, initial encounter: Secondary | ICD-10-CM | POA: Diagnosis not present

## 2020-01-08 MED ORDER — DROPERIDOL 2.5 MG/ML IJ SOLN
2.5000 mg | Freq: Once | INTRAMUSCULAR | Status: AC
  Administered 2020-01-08: 01:00:00 2.5 mg via INTRAVENOUS
  Filled 2020-01-08: qty 2

## 2020-01-08 NOTE — ED Notes (Signed)
[facility]  CALLED   FOR  TRANSPORT  TO [facility] 

## 2020-01-08 NOTE — Care Management (Addendum)
RN CM: LVMM for memory care director and business office updating patient was ready for discharge and needing confirmation patient was cleared to return.   RN CM: Received call from memory care director, Jinny Blossom, confirmed member is cleared to return to room D16. No need for report-daughter has already called and spoke to staff from hospital. Bedside nurse notified.

## 2020-01-08 NOTE — ED Provider Notes (Signed)
-----------------------------------------   12:39 AM on 01/08/2020 -----------------------------------------   Blood pressure 133/71, pulse 73, temperature 98 F (36.7 C), temperature source Oral, resp. rate 17, height 1.6 m (5\' 3" ), weight 54.4 kg, SpO2 94 %.  Patient awaiting social work evaluation for possible placement somewhere other than [facility].  At the moment the patient is agitated and trying to climb out of bed.  Family is at bedside given her chronic dementia and trying to redirect but she needs a calming agent.  QTC is appropriate for droperidol 2.5 mg IV which I have ordered.   Hinda Kehr, MD 01/08/20 [phone]

## 2020-01-08 NOTE — ED Notes (Signed)
Pt alert to name only, attempting to get OOB despite attempts to reorient pt by nurse and daughter; daughter is requesting med to help pt sleep; MD notified

## 2020-01-08 NOTE — ED Notes (Signed)
Family at bedside, updated on POC. Waiting for SW. Tech remains at bedside, assisted patient with breakfast and toileting.

## 2020-01-08 NOTE — TOC Initial Note (Signed)
Transition of Care [facility]) - Initial/Assessment Note    Patient Details  Name: Miranda Clay MRN: [id number] Date of Birth: 1935-07-12  [facility] CM/SW Contact:    Anselm Pancoast, RN Phone Number: 01/08/2020, 9:49 AM  Clinical Narrative:                 RN CM spoke with daughter, Landherr, who confirmed patient is to return to [facility] today. Daughter states patient has been medically cleared. RN CM will outreach to [facility] memory care and arrange transport.   Expected Discharge Plan: Memory Care     Patient Goals and CMS Choice Patient states their goals for this hospitalization and ongoing recovery are:: Return to return to [facility]      Expected Discharge Plan and Services Expected Discharge Plan: [place] arrangements for the past 2 months: Assisted Living Facility(memory care unit @ [facility])                                      Prior Living Arrangements/Services Living arrangements for the past 2 months: Assisted Living Facility(memory care unit @ [facility]) Lives with:: Facility Resident Patient language and need for interpreter reviewed:: Yes Do you feel safe going back to the place where you live?: Yes      Need for Family Participation in Patient Care: Yes (Comment) Care giver support system in place?: Yes (comment)   Criminal Activity/Legal Involvement Pertinent to Current Situation/Hospitalization: No - Comment as needed  Activities of Daily Living      Permission Sought/Granted                  Emotional Assessment Appearance:: Appears stated age Attitude/Demeanor/Rapport: Inconsistent Affect (typically observed): Agitated Orientation: : Oriented to Self Alcohol / Substance Use: Never Used Psych Involvement: No (comment)  Admission diagnosis:  Fall There are no problems to display for this patient.  PCP:  [facility] Pharmacy:  No Pharmacies Listed    Social Determinants  of Health (SDOH) Interventions    Readmission Risk Interventions No flowsheet data found.

## 2020-01-08 NOTE — ED Notes (Signed)
Pt cont to resting quietly in darkened exam room, remains awake, occasionally pulling at medical devices; pt pulled SL out--cannula intact, dsng applied; sitter remains at bedside

## 2020-01-08 NOTE — ED Notes (Signed)
Daughter now leaving and will return in morning; safety sitter now at bedside

## 2020-01-08 NOTE — ED Notes (Addendum)
Room darkened, sitter at bedside; pt resting quietly now, no distress noted

## 2020-01-08 NOTE — ED Notes (Signed)
Pt resting quietly in darkened exam room, remains awaken occasionally pulling at medical devices; sitter at bedside

## 2020-02-11 ENCOUNTER — Encounter: Payer: Self-pay | Admitting: Emergency Medicine

## 2020-02-11 ENCOUNTER — Emergency Department
Admission: EM | Admit: 2020-02-11 | Discharge: 2020-02-11 | Disposition: A | Discharge status: Home / Self Care | Payer: Medicare Other | Attending: Emergency Medicine | Admitting: Emergency Medicine

## 2020-02-11 ENCOUNTER — Other Ambulatory Visit: Payer: Self-pay

## 2020-02-11 DIAGNOSIS — F0390 Unspecified dementia without behavioral disturbance: Secondary | ICD-10-CM | POA: Insufficient documentation

## 2020-02-11 DIAGNOSIS — Z87891 Personal history of nicotine dependence: Secondary | ICD-10-CM | POA: Diagnosis not present

## 2020-02-11 DIAGNOSIS — I10 Essential (primary) hypertension: Secondary | ICD-10-CM | POA: Diagnosis not present

## 2020-02-11 DIAGNOSIS — E039 Hypothyroidism, unspecified: Secondary | ICD-10-CM | POA: Insufficient documentation

## 2020-02-11 DIAGNOSIS — Z7982 Long term (current) use of aspirin: Secondary | ICD-10-CM | POA: Diagnosis not present

## 2020-02-11 DIAGNOSIS — Z79899 Other long term (current) drug therapy: Secondary | ICD-10-CM | POA: Diagnosis not present

## 2020-02-11 DIAGNOSIS — E119 Type 2 diabetes mellitus without complications: Secondary | ICD-10-CM | POA: Insufficient documentation

## 2020-02-11 DIAGNOSIS — Z7984 Long term (current) use of oral hypoglycemic drugs: Secondary | ICD-10-CM | POA: Diagnosis not present

## 2020-02-11 DIAGNOSIS — R112 Nausea with vomiting, unspecified: Secondary | ICD-10-CM | POA: Insufficient documentation

## 2020-02-11 LAB — COMPREHENSIVE METABOLIC PANEL
ALT: 12 U/L (ref 0–44)
AST: 19 U/L (ref 15–41)
Albumin: 3.9 g/dL (ref 3.5–5.0)
Alkaline Phosphatase: 62 U/L (ref 38–126)
Anion gap: 15 (ref 5–15)
BUN: 17 mg/dL (ref 8–23)
CO2: 22 mmol/L (ref 22–32)
Calcium: 9.1 mg/dL (ref 8.9–10.3)
Chloride: 103 mmol/L (ref 98–111)
Creatinine, Ser: 0.44 mg/dL (ref 0.44–1.00)
GFR calc Af Amer: >60 mL/min (ref >60)
GFR calc non Af Amer: >60 mL/min (ref >60)
Glucose, Bld: 239 mg/dL — ABNORMAL HIGH (ref 70–99)
Potassium: 3.8 mmol/L (ref 3.5–5.1)
Sodium: 140 mmol/L (ref 135–145)
Total Bilirubin: 1 mg/dL (ref 0.3–1.2)
Total Protein: 6.8 g/dL (ref 6.5–8.1)

## 2020-02-11 LAB — CBC
HCT: 47.3 % — ABNORMAL HIGH (ref 36.0–46.0)
Hemoglobin: 15.5 g/dL — ABNORMAL HIGH (ref 12.0–15.0)
MCH: 31.4 pg (ref 26.0–34.0)
MCHC: 32.8 g/dL (ref 30.0–36.0)
MCV: 95.9 fL (ref 80.0–100.0)
Platelets: 329 10*3/uL (ref 150–400)
RBC: 4.93 MIL/uL (ref 3.87–5.11)
RDW: 13.3 % (ref 11.5–15.5)
WBC: 19.9 10*3/uL — ABNORMAL HIGH (ref 4.0–10.5)
nRBC: 0 % (ref 0.0–0.2)

## 2020-02-11 LAB — GLUCOSE, CAPILLARY: Glucose-Capillary: 210 mg/dL — ABNORMAL HIGH (ref 70–99)

## 2020-02-11 LAB — LIPASE, BLOOD: Lipase: 21 U/L (ref 11–51)

## 2020-02-11 MED ORDER — ONDANSETRON 4 MG PO TBDP: 4.0000 mg | ORAL_TABLET | Freq: Once | ORAL | Status: DC | PRN

## 2020-02-11 MED ORDER — ONDANSETRON HCL 4 MG/2ML IJ SOLN
4.0000 mg | Freq: Once | INTRAMUSCULAR | Status: AC
  Administered 2020-02-11: 4 mg via INTRAVENOUS
  Filled 2020-02-11: qty 2

## 2020-02-11 MED ORDER — ONDANSETRON 4 MG PO TBDP: 4.0000 mg | ORAL_TABLET | Freq: Three times a day (TID) | ORAL | 0 refills | Status: AC | PRN

## 2020-02-11 NOTE — ED Provider Notes (Signed)
[facility] Emergency Department Provider Note   ____________________________________________    I have reviewed the triage vital signs and the nursing notes.   HISTORY  Chief Complaint Emesis  History limited by significant dementia   HPI Miranda Clay is a 85 y.o. female with dementia, diabetes, hypertension who presents from the memory unit at [facility].  Apparently the patient had an episode of vomiting this morning.  Daughter is with the patient and has been waiting in the emergency department waiting room for some time and reports that the patient has been at her baseline with no further vomiting.  Patient has no complaints  Past Medical History:  Diagnosis Date  . Dementia ([place])   . Diabetes mellitus without complication ([place])   . Hyperlipidemia   . Hypertension   . Hypothyroidism     There are no problems to display for this patient.   History reviewed. No pertinent surgical history.  Prior to Admission medications   Medication Sig Start Date End Date Taking? Authorizing Provider  aspirin EC 81 MG tablet Take 81 mg by mouth daily.    [provider]  Cholecalciferol (VITAMIN D) 2000 UNITS tablet Take 2,000 Units by mouth daily.    [provider]  glipiZIDE (GLUCOTROL) 10 MG tablet Take 20 mg by mouth 2 (two) times daily.     [provider]  levothyroxine (SYNTHROID, LEVOTHROID) 25 MCG tablet Take 25 mcg by mouth daily before breakfast.    [provider]  lisinopril (ZESTRIL) 5 MG tablet Take 5 mg by mouth daily.    [provider]  memantine (NAMENDA) 10 MG tablet Take 10 mg by mouth 2 (two) times daily.    [provider]  metFORMIN (GLUCOPHAGE) 1000 MG tablet Take 1,000 mg by mouth 2 (two) times daily.     [provider]  ondansetron (ZOFRAN ODT) 4 MG disintegrating tablet Take 1 tablet (4 mg total) by mouth every 8 (eight) hours as needed. 02/11/20   Lavonia Drafts, MD    PARoxetine (PAXIL) 10 MG tablet Take 10 mg by mouth daily.    [provider]  pravastatin (PRAVACHOL) 20 MG tablet Take 20 mg by mouth at bedtime.     [provider]  Rivastigmine 13.3 MG/24HR PT24 Place 1 patch onto the skin daily.    [provider]  traZODone (DESYREL) 50 MG tablet Take 25 mg by mouth 2 (two) times daily as needed (for agitation). In the afternoon between 2-4 pm and at bedtime    [provider]  verapamil (CALAN-SR) 180 MG CR tablet Take 180 mg by mouth daily.    [provider]     Allergies Codeine  No family history on file.  Social History Social History   Tobacco Use  . Smoking status: Former Research scientist (life sciences)  . Smokeless tobacco: Never Used  Substance Use Topics  . Alcohol use: No  . Drug use: No    Review of Systems limited by dementia  Constitutional: No reports of fever Eyes: No visual changes.  ENT: No rhinorrhea Cardiovascular: Denies chest pain. Respiratory: Denies cough Gastrointestinal: As above Genitourinary: Negative for dysuria. Musculoskeletal: Negative for joint swelling Skin: Negative for rash. Neurological: Negative for headaches    ____________________________________________   PHYSICAL EXAM:  VITAL SIGNS: ED Triage Vitals  Enc Vitals Group     BP 02/11/20 1020 (!) 197/84     Pulse Rate 02/11/20 1020 75     Resp 02/11/20 1020  15     Temp 02/11/20 1020 97.7 F (36.5 C)     Temp Source 02/11/20 1020 Oral     SpO2 02/11/20 1020 96 %     Weight 02/11/20 1028 54 kg (119 lb 0.8 oz)     Height 02/11/20 1028 1.575 m (5\' 2" )     Head Circumference --      Peak Flow --      Pain Score --      Pain Loc --      Pain Edu? --      Excl. in [place]? --     Constitutional: Alert, disoriented but well-appearing Eyes: Conjunctivae are normal.  Head: Atraumatic. Nose: No congestion/rhinnorhea. Mouth/Throat: Mucous membranes are moist.    Cardiovascular: Normal rate, regular rhythm. Grossly  normal heart sounds.  Good peripheral circulation. Respiratory: Normal respiratory effort.  No retractions. Lungs CTAB. Gastrointestinal: Soft and nontender. No distention.    Musculoskeletal: No lower extremity tenderness nor edema.  Warm and well perfused Neurologic:  Normal speech and language. No gross focal neurologic deficits are appreciated.  Skin:  Skin is warm, dry and intact. No rash noted. Psychiatric: Mood and affect are normal. Speech and behavior are normal.  ____________________________________________   LABS (all labs ordered are listed, but only abnormal results are displayed)  Labs Reviewed  CBC - Abnormal; Notable for the following components:      Result Value   WBC 19.9 (*)    Hemoglobin 15.5 (*)    HCT 47.3 (*)    All other components within normal limits  GLUCOSE, CAPILLARY - Abnormal; Notable for the following components:   Glucose-Capillary 210 (*)    All other components within normal limits  COMPREHENSIVE METABOLIC PANEL - Abnormal; Notable for the following components:   Glucose, Bld 239 (*)    All other components within normal limits  LIPASE, BLOOD  URINALYSIS, COMPLETE (UACMP) WITH MICROSCOPIC  CBG MONITORING, ED   ____________________________________________  EKG  ED ECG REPORT I, Lavonia Drafts, the attending physician, personally viewed and interpreted this ECG.  Date: 02/11/2020  Rhythm: normal sinus rhythm QRS Axis: normal Intervals: normal ST/T Wave abnormalities: normal Narrative Interpretation: no evidence of acute ischemia  ____________________________________________  RADIOLOGY  None ____________________________________________   PROCEDURES  Procedure(s) performed: No  Procedures   Critical Care performed: No ____________________________________________   INITIAL IMPRESSION / ASSESSMENT AND PLAN / ED COURSE  Pertinent labs & imaging results that were available during my care of the patient were reviewed by me  and considered in my medical decision making (see chart for details).  Patient with episode of vomiting this morning however none since then.  [place] notes GI virus circulating in facility.  Patient has not had any further vomiting since initial episode.  Lab work today is overall reassuring, does have an elevated white blood cell count however that would be consistent with viral GI illness.  She has absolutely no abdominal tenderness to palpation, afebrile and very well-appearing.  Discussed labs with daughter and noted that any development of abdominal pain fever worsening condition she should return to the emergency department.    ____________________________________________   FINAL CLINICAL IMPRESSION(S) / ED DIAGNOSES  Final diagnoses:  Non-intractable vomiting with nausea, unspecified vomiting type        Note:  This document was prepared using Dragon voice recognition software and may include unintentional dictation errors.   Lavonia Drafts, MD 02/11/20 1535

## 2020-02-11 NOTE — Discharge Instructions (Signed)
Miranda Clay had a reassuring work-up today, she did not have any further vomiting.  She very well may have a viral gastroenteritis, nausea medication has been prescribed as needed.  If she develops abdominal pain or any worsening please return her to the emergency department.

## 2020-02-11 NOTE — ED Triage Notes (Signed)
Pt in via [facility] from [facility].  Pt alert to self only upon arrival, unable to answer my questions.  Per facility, N/V/D x 24 hours w/ increased confusion.  Emesis noted upon arrival.

## 2020-02-11 NOTE — ED Triage Notes (Signed)
Pt in via EMS from [facility]. Pt with dementia. Pt with NVD and decreased in alertness since yesterday. Pt did take AM meds and has not eaten anything today. IV# 20 g to right hand and has 500cc of fluid hanging.

## 2020-02-11 NOTE — ED Triage Notes (Signed)
NVD since yesterday. Tried to give food this am but vomited. Pt unable to give information but per facility can call son if needed.     180/96 now 165/70

## 2020-03-27 ENCOUNTER — Emergency Department
Admission: EM | Admit: 2020-03-27 | Discharge: 2020-03-27 | Disposition: A | Discharge status: Home / Self Care | Payer: Medicare Other | Source: Home / Self Care | Attending: Emergency Medicine | Admitting: Emergency Medicine

## 2020-03-27 ENCOUNTER — Encounter: Payer: Self-pay | Admitting: Emergency Medicine

## 2020-03-27 ENCOUNTER — Emergency Department
Admission: EM | Admit: 2020-03-27 | Discharge: 2020-03-27 | Disposition: A | Discharge status: Skilled Nursing Facility | Payer: Medicare Other | Attending: Emergency Medicine | Admitting: Emergency Medicine

## 2020-03-27 ENCOUNTER — Other Ambulatory Visit: Payer: Self-pay

## 2020-03-27 ENCOUNTER — Emergency Department: Payer: Medicare Other

## 2020-03-27 DIAGNOSIS — I10 Essential (primary) hypertension: Secondary | ICD-10-CM | POA: Insufficient documentation

## 2020-03-27 DIAGNOSIS — Z7901 Long term (current) use of anticoagulants: Secondary | ICD-10-CM | POA: Insufficient documentation

## 2020-03-27 DIAGNOSIS — E039 Hypothyroidism, unspecified: Secondary | ICD-10-CM | POA: Diagnosis not present

## 2020-03-27 DIAGNOSIS — Y939 Activity, unspecified: Secondary | ICD-10-CM | POA: Insufficient documentation

## 2020-03-27 DIAGNOSIS — S7002XA Contusion of left hip, initial encounter: Secondary | ICD-10-CM | POA: Insufficient documentation

## 2020-03-27 DIAGNOSIS — Y999 Unspecified external cause status: Secondary | ICD-10-CM | POA: Insufficient documentation

## 2020-03-27 DIAGNOSIS — S0990XA Unspecified injury of head, initial encounter: Secondary | ICD-10-CM

## 2020-03-27 DIAGNOSIS — W050XXA Fall from non-moving wheelchair, initial encounter: Secondary | ICD-10-CM | POA: Insufficient documentation

## 2020-03-27 DIAGNOSIS — Z7982 Long term (current) use of aspirin: Secondary | ICD-10-CM | POA: Insufficient documentation

## 2020-03-27 DIAGNOSIS — E119 Type 2 diabetes mellitus without complications: Secondary | ICD-10-CM | POA: Diagnosis not present

## 2020-03-27 DIAGNOSIS — Z7984 Long term (current) use of oral hypoglycemic drugs: Secondary | ICD-10-CM | POA: Insufficient documentation

## 2020-03-27 DIAGNOSIS — Z87891 Personal history of nicotine dependence: Secondary | ICD-10-CM | POA: Insufficient documentation

## 2020-03-27 DIAGNOSIS — Y9389 Activity, other specified: Secondary | ICD-10-CM | POA: Insufficient documentation

## 2020-03-27 DIAGNOSIS — S0083XA Contusion of other part of head, initial encounter: Secondary | ICD-10-CM | POA: Diagnosis not present

## 2020-03-27 DIAGNOSIS — Z79899 Other long term (current) drug therapy: Secondary | ICD-10-CM | POA: Diagnosis not present

## 2020-03-27 DIAGNOSIS — Y92129 Unspecified place in nursing home as the place of occurrence of the external cause: Secondary | ICD-10-CM | POA: Insufficient documentation

## 2020-03-27 DIAGNOSIS — F0390 Unspecified dementia without behavioral disturbance: Secondary | ICD-10-CM | POA: Insufficient documentation

## 2020-03-27 DIAGNOSIS — T148XXA Other injury of unspecified body region, initial encounter: Secondary | ICD-10-CM

## 2020-03-27 NOTE — Discharge Instructions (Addendum)
Please ice the hematoma and check it daily to make sure it is not significantly worsening

## 2020-03-27 NOTE — ED Notes (Signed)
Pt has hematoma to left hip area. Pt has pain upon palpation, but patient noted to transfer well when dc'd earlier today. NAD noted. Pt confused at baseline.

## 2020-03-27 NOTE — ED Notes (Signed)
Pt daughters verbalizes dc instructions. Unable to sign due to inaccessibility to signature pad. Pt verbalizes consent for dc.

## 2020-03-27 NOTE — ED Provider Notes (Signed)
[facility] Emergency Department Provider Note   ____________________________________________    I have reviewed the triage vital signs and the nursing notes.   HISTORY  Chief Complaint Fall   History limited by dementia  HPI Miranda Clay is a 85 y.o. female with a history of dementia and diabetes was seen earlier today after a fall from wheelchair.  Had reassuring work-up at the time.  Returns to the emergency department because of swelling to the left hip and concerns about possible hip injury.  Patient is not on blood thinners.  No other complaints reported.  Past Medical History:  Diagnosis Date  . Dementia ([place])   . Diabetes mellitus without complication ([place])   . Hyperlipidemia   . Hypertension   . Hypothyroidism     There are no problems to display for this patient.   History reviewed. No pertinent surgical history.  Prior to Admission medications   Medication Sig Start Date End Date Taking? Authorizing Provider  aspirin EC 81 MG tablet Take 81 mg by mouth daily.    [provider]  Cholecalciferol (VITAMIN D) 2000 UNITS tablet Take 2,000 Units by mouth daily.    [provider]  glipiZIDE (GLUCOTROL) 10 MG tablet Take 20 mg by mouth 2 (two) times daily.     [provider]  levothyroxine (SYNTHROID, LEVOTHROID) 25 MCG tablet Take 25 mcg by mouth daily before breakfast.    [provider]  lisinopril (ZESTRIL) 5 MG tablet Take 5 mg by mouth daily.    [provider]  memantine (NAMENDA) 10 MG tablet Take 10 mg by mouth 2 (two) times daily.    [provider]  metFORMIN (GLUCOPHAGE) 1000 MG tablet Take 1,000 mg by mouth 2 (two) times daily.     [provider]  ondansetron (ZOFRAN ODT) 4 MG disintegrating tablet Take 1 tablet (4 mg total) by mouth every 8 (eight) hours as needed. 02/11/20   Lavonia Drafts, MD  PARoxetine (PAXIL) 10 MG tablet Take 10 mg by mouth daily.    [provider]  pravastatin (PRAVACHOL) 20 MG tablet Take 20 mg by mouth at bedtime.     [provider]  Rivastigmine 13.3 MG/24HR PT24 Place 1 patch onto the skin daily.    [provider]  traZODone (DESYREL) 50 MG tablet Take 25 mg by mouth 2 (two) times daily as needed (for agitation). In the afternoon between 2-4 pm and at bedtime    [provider]  verapamil (CALAN-SR) 180 MG CR tablet Take 180 mg by mouth daily.    [provider]     Allergies Codeine  History reviewed. No pertinent family history.  Social History Social History   Tobacco Use  . Smoking status: Former Research scientist (life sciences)  . Smokeless tobacco: Never Used  Substance Use Topics  . Alcohol use: No  . Drug use: No    Review of Systems       Gastrointestinal: No vomiting reported  Musculoskeletal: As above Skin: No laceration     ____________________________________________   PHYSICAL EXAM:  VITAL SIGNS: ED Triage Vitals  Enc Vitals Group     BP 03/27/20 1516 (!) 159/70     Pulse Rate 03/27/20 1516 96     Resp 03/27/20 1516 17     Temp 03/27/20 1516 97.8 F (36.6 C)     Temp Source 03/27/20 1516 Oral     SpO2 03/27/20 1516 95 %     Weight  03/27/20 1517 54 kg (119 lb 0.8 oz)     Height --      Head Circumference --      Peak Flow --      Pain Score --      Pain Loc --      Pain Edu? --      Excl. in [place]? --      Constitutional: Alert   Nose: No congestion/rhinnorhea. Mouth/Throat: Mucous membranes are moist.   Cardiovascular: Normal rate, regular rhythm.  Respiratory: Normal respiratory effort.  No retractions.  Musculoskeletal: Patient with swelling to the left lateral proximal thigh, highly suspicious for hematoma.  Normal range of motion of the legs, no pain with axial load.  Warm and well-perfused distally Neurologic:  Normal speech and language. No gross focal neurologic deficits are appreciated.   Skin:  Skin is warm, dry and intact.     ____________________________________________   LABS (all labs ordered are listed, but only abnormal results are displayed)  Labs Reviewed - No data to display ____________________________________________  EKG   ____________________________________________  RADIOLOGY  Hip x-ray negative for fracture ____________________________________________   PROCEDURES  Procedure(s) performed: No  Procedures   Critical Care performed: No ____________________________________________   INITIAL IMPRESSION / ASSESSMENT AND PLAN / ED COURSE  Pertinent labs & imaging results that were available during my care of the patient were reviewed by me and considered in my medical decision making (see chart for details).  Patient with reassuring exam.  Swelling is likely due to hematoma.  Patient is on blood thinners.  X-ray negative for fracture.  Recommend supportive care, ice, outpatient follow-up as needed.  Return precautions discussed with daughter   ____________________________________________   FINAL CLINICAL IMPRESSION(S) / ED DIAGNOSES  Final diagnoses:  Hematoma  Contusion of left hip, initial encounter      NEW MEDICATIONS STARTED DURING THIS VISIT:  Discharge Medication List as of 03/27/2020  4:25 PM       Note:  This document was prepared using Dragon voice recognition software and may include unintentional dictation errors.   Lavonia Drafts, MD 03/27/20 [facility]

## 2020-03-27 NOTE — ED Notes (Signed)
Daughter at bedside, pt remains calm and cooperative at this time. Pt appears to be in NAD at this time.

## 2020-03-27 NOTE — ED Provider Notes (Signed)
[facility] Emergency Department Provider Note  Time seen: 11:29 AM  I have reviewed the triage vital signs and the nursing notes.   HISTORY  Chief Complaint Fall   HPI Miranda Clay is a 85 y.o. female with a past medical history of dementia, diabetes, hypertension, hyperlipidemia presents to the emergency department after falling forward out of her wheelchair hitting her head.   According to EMS report patient comes from the memory care unit of [facility] nursing facility.  No reported anticoagulation.  Patient unable to contribute to her history or review of systems secondary to baseline dementia.  Past Medical History:  Diagnosis Date  . Dementia ([place])   . Diabetes mellitus without complication ([place])   . Hyperlipidemia   . Hypertension   . Hypothyroidism     There are no problems to display for this patient.   No past surgical history on file.  Prior to Admission medications   Medication Sig Start Date End Date Taking? Authorizing Provider  aspirin EC 81 MG tablet Take 81 mg by mouth daily.    [provider]  Cholecalciferol (VITAMIN D) 2000 UNITS tablet Take 2,000 Units by mouth daily.    [provider]  glipiZIDE (GLUCOTROL) 10 MG tablet Take 20 mg by mouth 2 (two) times daily.     [provider]  levothyroxine (SYNTHROID, LEVOTHROID) 25 MCG tablet Take 25 mcg by mouth daily before breakfast.    [provider]  lisinopril (ZESTRIL) 5 MG tablet Take 5 mg by mouth daily.    [provider]  memantine (NAMENDA) 10 MG tablet Take 10 mg by mouth 2 (two) times daily.    [provider]  metFORMIN (GLUCOPHAGE) 1000 MG tablet Take 1,000 mg by mouth 2 (two) times daily.     [provider]  ondansetron (ZOFRAN ODT) 4 MG disintegrating tablet Take 1 tablet (4 mg total) by mouth every 8 (eight) hours as needed. 02/11/20   Lavonia Drafts, MD  PARoxetine (PAXIL) 10 MG tablet Take 10 mg by mouth  daily.    [provider]  pravastatin (PRAVACHOL) 20 MG tablet Take 20 mg by mouth at bedtime.     [provider]  Rivastigmine 13.3 MG/24HR PT24 Place 1 patch onto the skin daily.    [provider]  traZODone (DESYREL) 50 MG tablet Take 25 mg by mouth 2 (two) times daily as needed (for agitation). In the afternoon between 2-4 pm and at bedtime    [provider]  verapamil (CALAN-SR) 180 MG CR tablet Take 180 mg by mouth daily.    [provider]    Allergies  Allergen Reactions  . Codeine Nausea And Vomiting    No family history on file.  Social History Social History   Tobacco Use  . Smoking status: Former Research scientist (life sciences)  . Smokeless tobacco: Never Used  Substance Use Topics  . Alcohol use: No  . Drug use: No    Review of Systems Unable to obtain adequate/accurate review of systems secondary to baseline dementia  ____________________________________________   PHYSICAL EXAM:  VITAL SIGNS: ED Triage Vitals  Enc Vitals Group     BP 03/27/20 1116 (!) 146/61     Pulse Rate 03/27/20 1116 70     Resp 03/27/20 1116 17     Temp 03/27/20 1116 97.7 F (36.5 C)     Temp Source 03/27/20 1116 Oral     SpO2 03/27/20 1116 93 %  Weight 03/27/20 1117 119 lb 0.8 oz (54 kg)     Height --      Head Circumference --      Peak Flow --      Pain Score --      Pain Loc --      Pain Edu? --      Excl. in [place]? --     Constitutional: Patient is awake and alert not oriented.  Answers questions but with an accuracy.  Baseline dementia. Eyes: Normal exam ENT      Head: Normocephalic and atraumatic.      Mouth/Throat: Mucous membranes are moist. Cardiovascular: Normal rate, regular rhythm.  Respiratory: Normal respiratory effort without tachypnea nor retractions. Breath sounds are clear Gastrointestinal: Soft and nontender. No distention.  Musculoskeletal: Nontender with normal range of motion in all extremities.  Neurologic:  Normal speech  and language. No gross focal neurologic deficits  Skin:  Skin is warm, dry and intact.  Psychiatric: Mood and affect are normal.   ____________________________________________    EKG  EKG viewed and interpreted by myself shows a sinus tachycardia 107 bpm the narrow QRS, normal axis, normal intervals, nonspecific ST changes.  ____________________________________________    RADIOLOGY  CT scan is negative  ____________________________________________   INITIAL IMPRESSION / ASSESSMENT AND PLAN / ED COURSE  Pertinent labs & imaging results that were available during my care of the patient were reviewed by me and considered in my medical decision making (see chart for details).   Patient presents to the emergency department after falling out of her wheelchair per EMS report.  Patient has a hematoma with small abrasion to left forehead.  Overall the patient appears well, she does converse with the although is confused which is her reported baseline.  Moves all extremities well.  No apparent tenderness or discomfort with range of motion of extremities C-spine palpation chest abdominal palpation.  Given her forehead hematoma we will obtain CT imaging of the head as a precaution.  CT scan negative for acute abnormality.  We will discharge the patient back to her nursing facility.  Kehaulani Odegaard was evaluated in Emergency Department on 03/27/2020 for the symptoms described in the history of present illness. She was evaluated in the context of the global COVID-19 pandemic, which necessitated consideration that the patient might be at risk for infection with the SARS-CoV-2 virus that causes COVID-19. Institutional protocols and algorithms that pertain to the evaluation of patients at risk for COVID-19 are in a state of rapid change based on information released by regulatory bodies including the CDC and federal and state organizations. These policies and algorithms were followed during the patient's care in  the ED.  ____________________________________________   FINAL CLINICAL IMPRESSION(S) / ED DIAGNOSES  Fall Head injury   Harvest Dark, MD 03/27/20 1258

## 2020-03-27 NOTE — ED Notes (Signed)
E-signature not working at this time. Pt daughter verbalized understanding of D/C instructions, prescriptions and follow up care with no further questions at this time. Pt in NAD and wheeled to daughter's car  at time of D/C. Pt daughter taking pt back to [facility]

## 2020-03-27 NOTE — ED Triage Notes (Signed)
Pt presents via [facility] with c/o hematoma on left hip. No new fall since pt recently D/C today. When pt D/C pt was able to stand and pivot with assistance into wheel chair. Facility reports when pt arrived back to facility, staff was helping pt use restroom and noticed hematoma to left hip. Bruising noted to left hip. Pt appears to be in NAD at this time. Hx of dementia.

## 2020-03-27 NOTE — ED Triage Notes (Signed)
Pt presents from [facility] via [facility] with c/o fall from wheel chair. Staff reported that pt hit head with fall. Pt does not take blood thinners. Pt alert, but confused at this time. Pt has hx of dementia and confused at baseline. Staff at facility reported that pt is not more confused than normal at this time. Hematoma noted to left side of forehead.

## 2020-03-27 NOTE — ED Notes (Signed)
Pt transported to xray 

## 2020-04-02 ENCOUNTER — Inpatient Hospital Stay
Admission: EM | Admit: 2020-04-02 | Discharge: 2020-04-04 | DRG: 640 | Disposition: A | Discharge status: Home Health | Payer: Medicare Other | Source: Skilled Nursing Facility | Attending: Family Medicine | Admitting: Family Medicine

## 2020-04-02 ENCOUNTER — Encounter: Payer: Self-pay | Admitting: Emergency Medicine

## 2020-04-02 ENCOUNTER — Emergency Department: Payer: Medicare Other

## 2020-04-02 ENCOUNTER — Other Ambulatory Visit: Payer: Self-pay

## 2020-04-02 DIAGNOSIS — E876 Hypokalemia: Secondary | ICD-10-CM | POA: Diagnosis present

## 2020-04-02 DIAGNOSIS — E785 Hyperlipidemia, unspecified: Secondary | ICD-10-CM | POA: Diagnosis present

## 2020-04-02 DIAGNOSIS — E860 Dehydration: Secondary | ICD-10-CM | POA: Diagnosis present

## 2020-04-02 DIAGNOSIS — I10 Essential (primary) hypertension: Secondary | ICD-10-CM | POA: Diagnosis present

## 2020-04-02 DIAGNOSIS — Z87891 Personal history of nicotine dependence: Secondary | ICD-10-CM

## 2020-04-02 DIAGNOSIS — E872 Acidosis: Secondary | ICD-10-CM | POA: Diagnosis present

## 2020-04-02 DIAGNOSIS — Z79899 Other long term (current) drug therapy: Secondary | ICD-10-CM

## 2020-04-02 DIAGNOSIS — Z794 Long term (current) use of insulin: Secondary | ICD-10-CM | POA: Diagnosis not present

## 2020-04-02 DIAGNOSIS — G309 Alzheimer's disease, unspecified: Secondary | ICD-10-CM

## 2020-04-02 DIAGNOSIS — G9341 Metabolic encephalopathy: Secondary | ICD-10-CM | POA: Diagnosis present

## 2020-04-02 DIAGNOSIS — Z79890 Hormone replacement therapy (postmenopausal): Secondary | ICD-10-CM | POA: Diagnosis not present

## 2020-04-02 DIAGNOSIS — R296 Repeated falls: Secondary | ICD-10-CM | POA: Diagnosis present

## 2020-04-02 DIAGNOSIS — Z66 Do not resuscitate: Secondary | ICD-10-CM | POA: Diagnosis present

## 2020-04-02 DIAGNOSIS — E43 Unspecified severe protein-calorie malnutrition: Secondary | ICD-10-CM | POA: Insufficient documentation

## 2020-04-02 DIAGNOSIS — F0281 Dementia in other diseases classified elsewhere with behavioral disturbance: Secondary | ICD-10-CM

## 2020-04-02 DIAGNOSIS — Z7984 Long term (current) use of oral hypoglycemic drugs: Secondary | ICD-10-CM | POA: Diagnosis not present

## 2020-04-02 DIAGNOSIS — N390 Urinary tract infection, site not specified: Secondary | ICD-10-CM | POA: Diagnosis not present

## 2020-04-02 DIAGNOSIS — R739 Hyperglycemia, unspecified: Secondary | ICD-10-CM

## 2020-04-02 DIAGNOSIS — R4182 Altered mental status, unspecified: Secondary | ICD-10-CM

## 2020-04-02 DIAGNOSIS — F0390 Unspecified dementia without behavioral disturbance: Secondary | ICD-10-CM | POA: Diagnosis present

## 2020-04-02 DIAGNOSIS — Z7982 Long term (current) use of aspirin: Secondary | ICD-10-CM | POA: Diagnosis not present

## 2020-04-02 DIAGNOSIS — E1165 Type 2 diabetes mellitus with hyperglycemia: Secondary | ICD-10-CM | POA: Diagnosis present

## 2020-04-02 DIAGNOSIS — E039 Hypothyroidism, unspecified: Secondary | ICD-10-CM | POA: Diagnosis present

## 2020-04-02 DIAGNOSIS — Z20822 Contact with and (suspected) exposure to covid-19: Secondary | ICD-10-CM | POA: Diagnosis present

## 2020-04-02 LAB — SARS CORONAVIRUS 2 BY RT PCR (HOSPITAL ORDER, PERFORMED IN ~~LOC~~ HOSPITAL LAB): SARS Coronavirus 2: NEGATIVE

## 2020-04-02 LAB — CBC WITH DIFFERENTIAL/PLATELET
Abs Immature Granulocytes: 0.07 10*3/uL (ref 0.00–0.07)
Basophils Absolute: 0.1 10*3/uL (ref 0.0–0.1)
Basophils Relative: 1 %
Eosinophils Absolute: 0.1 10*3/uL (ref 0.0–0.5)
Eosinophils Relative: 1 %
HCT: 38.6 % (ref 36.0–46.0)
Hemoglobin: 12.7 g/dL (ref 12.0–15.0)
Immature Granulocytes: 1 %
Lymphocytes Relative: 16 %
Lymphs Abs: 1.7 10*3/uL (ref 0.7–4.0)
MCH: 31.1 pg (ref 26.0–34.0)
MCHC: 32.9 g/dL (ref 30.0–36.0)
MCV: 94.6 fL (ref 80.0–100.0)
Monocytes Absolute: 1.1 10*3/uL — ABNORMAL HIGH (ref 0.1–1.0)
Monocytes Relative: 10 %
Neutro Abs: 7.9 10*3/uL — ABNORMAL HIGH (ref 1.7–7.7)
Neutrophils Relative %: 71 %
Platelets: 399 10*3/uL (ref 150–400)
RBC: 4.08 MIL/uL (ref 3.87–5.11)
RDW: 13.9 % (ref 11.5–15.5)
WBC: 10.9 10*3/uL — ABNORMAL HIGH (ref 4.0–10.5)
nRBC: 0 % (ref 0.0–0.2)

## 2020-04-02 LAB — URINALYSIS, COMPLETE (UACMP) WITH MICROSCOPIC
Bilirubin Urine: NEGATIVE
Glucose, UA: >=500 mg/dL — AB
Hgb urine dipstick: NEGATIVE
Ketones, ur: 80 mg/dL — AB
Nitrite: NEGATIVE
Protein, ur: NEGATIVE mg/dL
Specific Gravity, Urine: 1.028 (ref 1.005–1.030)
Squamous Epithelial / HPF: NONE SEEN (ref 0–5)
pH: 5 (ref 5.0–8.0)

## 2020-04-02 LAB — HEMOGLOBIN A1C
Hgb A1c MFr Bld: 9.8 % — ABNORMAL HIGH (ref 4.8–5.6)
Mean Plasma Glucose: 234.56 mg/dL

## 2020-04-02 LAB — BASIC METABOLIC PANEL
Anion gap: 13 (ref 5–15)
BUN: 24 mg/dL — ABNORMAL HIGH (ref 8–23)
CO2: 27 mmol/L (ref 22–32)
Calcium: 9.8 mg/dL (ref 8.9–10.3)
Chloride: 104 mmol/L (ref 98–111)
Creatinine, Ser: 0.51 mg/dL (ref 0.44–1.00)
GFR calc Af Amer: >60 mL/min (ref >60)
GFR calc non Af Amer: >60 mL/min (ref >60)
Glucose, Bld: 440 mg/dL — ABNORMAL HIGH (ref 70–99)
Potassium: 4 mmol/L (ref 3.5–5.1)
Sodium: 144 mmol/L (ref 135–145)

## 2020-04-02 LAB — GLUCOSE, CAPILLARY
Glucose-Capillary: 404 mg/dL — ABNORMAL HIGH (ref 70–99)
Glucose-Capillary: 248 mg/dL — ABNORMAL HIGH (ref 70–99)
Glucose-Capillary: 227 mg/dL — ABNORMAL HIGH (ref 70–99)

## 2020-04-02 MED ORDER — SODIUM CHLORIDE 0.9 % IV SOLN: INTRAVENOUS | Status: DC

## 2020-04-02 MED ORDER — RIVASTIGMINE 4.6 MG/24HR TD PT24
4.6000 mg | MEDICATED_PATCH | Freq: Every day | TRANSDERMAL | Status: DC
  Administered 2020-04-02 – 2020-04-04 (×3): 4.6 mg via TRANSDERMAL
  Filled 2020-04-02 (×3): qty 1

## 2020-04-02 MED ORDER — PAROXETINE HCL 10 MG PO TABS
10.0000 mg | ORAL_TABLET | Freq: Every day | ORAL | Status: DC
  Administered 2020-04-03 – 2020-04-04 (×2): 10 mg via ORAL
  Filled 2020-04-02 (×2): qty 1

## 2020-04-02 MED ORDER — TRAZODONE HCL 50 MG PO TABS
25.0000 mg | ORAL_TABLET | Freq: Two times a day (BID) | ORAL | Status: DC | PRN
  Administered 2020-04-04: 25 mg via ORAL
  Filled 2020-04-02: qty 1

## 2020-04-02 MED ORDER — LEVOTHYROXINE SODIUM 25 MCG PO TABS
25.0000 ug | ORAL_TABLET | Freq: Every day | ORAL | Status: DC
  Administered 2020-04-04: 25 ug via ORAL
  Filled 2020-04-02: qty 1

## 2020-04-02 MED ORDER — ACETAMINOPHEN 325 MG PO TABS: 650.0000 mg | ORAL_TABLET | Freq: Four times a day (QID) | ORAL | Status: DC | PRN

## 2020-04-02 MED ORDER — PRAVASTATIN SODIUM 20 MG PO TABS
20.0000 mg | ORAL_TABLET | Freq: Every day | ORAL | Status: DC
  Administered 2020-04-04: 20 mg via ORAL
  Filled 2020-04-02 (×2): qty 1

## 2020-04-02 MED ORDER — ENOXAPARIN SODIUM 40 MG/0.4ML ~~LOC~~ SOLN
40.0000 mg | SUBCUTANEOUS | Status: DC
  Administered 2020-04-04: 40 mg via SUBCUTANEOUS
  Filled 2020-04-02 (×2): qty 0.4

## 2020-04-02 MED ORDER — MEMANTINE HCL 5 MG PO TABS
10.0000 mg | ORAL_TABLET | Freq: Two times a day (BID) | ORAL | Status: DC
  Administered 2020-04-03 – 2020-04-04 (×3): 10 mg via ORAL
  Filled 2020-04-02 (×4): qty 2

## 2020-04-02 MED ORDER — INSULIN ASPART 100 UNIT/ML ~~LOC~~ SOLN
0.0000 [IU] | Freq: Three times a day (TID) | SUBCUTANEOUS | Status: DC
  Administered 2020-04-02: 5 [IU] via SUBCUTANEOUS
  Administered 2020-04-03: 3 [IU] via SUBCUTANEOUS
  Administered 2020-04-03: 2 [IU] via SUBCUTANEOUS
  Administered 2020-04-03: 5 [IU] via SUBCUTANEOUS
  Administered 2020-04-04: 8 [IU] via SUBCUTANEOUS
  Administered 2020-04-04: 3 [IU] via SUBCUTANEOUS
  Filled 2020-04-02 (×6): qty 1

## 2020-04-02 MED ORDER — SODIUM CHLORIDE 0.9 % IV SOLN
1.0000 g | INTRAVENOUS | Status: DC
  Administered 2020-04-03: 1 g via INTRAVENOUS
  Filled 2020-04-02: qty 1

## 2020-04-02 MED ORDER — LISINOPRIL 5 MG PO TABS
5.0000 mg | ORAL_TABLET | Freq: Every day | ORAL | Status: DC
  Administered 2020-04-03 – 2020-04-04 (×2): 5 mg via ORAL
  Filled 2020-04-02 (×2): qty 1

## 2020-04-02 MED ORDER — RIVASTIGMINE 9.5 MG/24HR TD PT24
9.5000 mg | MEDICATED_PATCH | Freq: Every day | TRANSDERMAL | Status: DC
  Administered 2020-04-02 – 2020-04-04 (×3): 9.5 mg via TRANSDERMAL
  Filled 2020-04-02 (×3): qty 1

## 2020-04-02 MED ORDER — ASPIRIN EC 81 MG PO TBEC
81.0000 mg | DELAYED_RELEASE_TABLET | Freq: Every day | ORAL | Status: DC
  Administered 2020-04-03 – 2020-04-04 (×2): 81 mg via ORAL
  Filled 2020-04-02 (×2): qty 1

## 2020-04-02 MED ORDER — VERAPAMIL HCL ER 180 MG PO TBCR
180.0000 mg | EXTENDED_RELEASE_TABLET | Freq: Every day | ORAL | Status: DC
  Administered 2020-04-03 – 2020-04-04 (×2): 180 mg via ORAL
  Filled 2020-04-02 (×3): qty 1

## 2020-04-02 MED ORDER — SODIUM CHLORIDE 0.9 % IV SOLN
1.0000 g | Freq: Once | INTRAVENOUS | Status: AC
  Administered 2020-04-02: 1 g via INTRAVENOUS
  Filled 2020-04-02: qty 10

## 2020-04-02 MED ORDER — RIVASTIGMINE 13.3 MG/24HR TD PT24: 13.3000 mg | MEDICATED_PATCH | Freq: Every day | TRANSDERMAL | Status: DC

## 2020-04-02 MED ORDER — ACETAMINOPHEN 650 MG RE SUPP: 650.0000 mg | Freq: Four times a day (QID) | RECTAL | Status: DC | PRN

## 2020-04-02 MED ORDER — SODIUM CHLORIDE 0.9 % IV BOLUS
1000.0000 mL | Freq: Once | INTRAVENOUS | Status: AC
  Administered 2020-04-02: 1000 mL via INTRAVENOUS

## 2020-04-02 MED ORDER — VITAMIN D 25 MCG (1000 UNIT) PO TABS
2000.0000 [IU] | ORAL_TABLET | Freq: Every day | ORAL | Status: DC
  Administered 2020-04-03 – 2020-04-04 (×2): 2000 [IU] via ORAL
  Filled 2020-04-02 (×2): qty 2

## 2020-04-02 MED ORDER — ONDANSETRON HCL 4 MG PO TABS: 4.0000 mg | ORAL_TABLET | Freq: Four times a day (QID) | ORAL | Status: DC | PRN

## 2020-04-02 MED ORDER — ONDANSETRON HCL 4 MG/2ML IJ SOLN: 4.0000 mg | Freq: Four times a day (QID) | INTRAMUSCULAR | Status: DC | PRN

## 2020-04-02 NOTE — ED Provider Notes (Signed)
[facility] Emergency Department Provider Note  ____________________________________________   First MD Initiated Contact with Patient 04/02/20 [id number]     (approximate)  I have reviewed the triage vital signs and the nursing notes.  History  Chief Complaint Altered Mental Status and Hyperglycemia    HPI Miranda Clay is a 85 y.o. female with history of dementia, DM, HTN, HLD, hypothyroidism who presents from [facility] assisted living facility via EMS.  Per staff she has been increasingly more weak and less interactive than normal for the last few days.  This morning she woke up, had breakfast, and was noted by physical therapy to not be her normal self, therefore prompting EMS evaluation.  They deny any recent falls or injuries, though she has been seen here recently for frequent falls.  Per EMS RA O2 92%, placed on 2 LNC.  Glucose in 500s.  Given 500 cc NS.  Per staff, patient is normally verbal and interactive, though confused at baseline.  Patient unable to provide any significant history, secondary to her confusion and dementia.   Past Medical Hx Past Medical History:  Diagnosis Date  . Dementia ([place])   . Diabetes mellitus without complication ([place])   . Hyperlipidemia   . Hypertension   . Hypothyroidism     Problem List There are no problems to display for this patient.   Past Surgical Hx History reviewed. No pertinent surgical history.  Medications Prior to Admission medications   Medication Sig Start Date End Date Taking? Authorizing Provider  aspirin EC 81 MG tablet Take 81 mg by mouth daily.    [provider]  Cholecalciferol (VITAMIN D) 2000 UNITS tablet Take 2,000 Units by mouth daily.    [provider]  glipiZIDE (GLUCOTROL) 10 MG tablet Take 20 mg by mouth 2 (two) times daily.     [provider]  levothyroxine (SYNTHROID, LEVOTHROID) 25 MCG tablet Take 25 mcg by mouth daily before breakfast.    [provider]  lisinopril (ZESTRIL) 5 MG tablet Take 5 mg by mouth daily.    [provider]  memantine (NAMENDA) 10 MG tablet Take 10 mg by mouth 2 (two) times daily.    [provider]  metFORMIN (GLUCOPHAGE) 1000 MG tablet Take 1,000 mg by mouth 2 (two) times daily.     [provider]  ondansetron (ZOFRAN ODT) 4 MG disintegrating tablet Take 1 tablet (4 mg total) by mouth every 8 (eight) hours as needed. 02/11/20   Lavonia Drafts, MD  PARoxetine (PAXIL) 10 MG tablet Take 10 mg by mouth daily.    [provider]  pravastatin (PRAVACHOL) 20 MG tablet Take 20 mg by mouth at bedtime.     [provider]  Rivastigmine 13.3 MG/24HR PT24 Place 1 patch onto the skin daily.    [provider]  traZODone (DESYREL) 50 MG tablet Take 25 mg by mouth 2 (two) times daily as needed (for agitation). In the afternoon between 2-4 pm and at bedtime    [provider]  verapamil (CALAN-SR) 180 MG CR tablet Take 180 mg by mouth daily.    [provider]    Allergies Codeine  Family Hx No family history on file.  Social Hx Social History   Tobacco Use  . Smoking status: Former Research scientist (life sciences)  . Smokeless tobacco: Never Used  Substance Use Topics  . Alcohol use: No  . Drug use: No     Review of Systems Unable to accurately obtain  due to patient's history of dementia and altered mental status.   Physical Exam  Vital Signs: ED Triage Vitals  Enc Vitals Group     BP 04/02/20 0938 (!) 125/59     Pulse Rate 04/02/20 0938 77     Resp 04/02/20 0938 18     Temp --      Temp src --      SpO2 04/02/20 0938 94 %     Weight 04/02/20 0939 119 lb 0.8 oz (54 kg)     Height --      Head Circumference --      Peak Flow --      Pain Score --      Pain Loc --      Pain Edu? --      Excl. in [place]? --     Constitutional: Awake and alert, able to state her first name, but otherwise confused and does not answer questions in a logical  way. Head: Normocephalic.  Old ecchymosis to the right sided face. Eyes: Conjunctivae clear. Sclera anicteric. Pupils equal and symmetric. Nose: No masses or lesions. No congestion or rhinorrhea. Mouth/Throat: Wearing mask.  Neck: No stridor. Trachea midline.  Cardiovascular: Normal rate, regular rhythm. Extremities well perfused. Respiratory: Normal respiratory effort.  Lungs CTAB.  Arrives on 2 LNC by EMS. Gastrointestinal: Soft. Non-distended.  Seemingly non-tender.  Genitourinary: Deferred. Musculoskeletal: No lower extremity edema. No deformities.  FROM with no apparent tenderness to bilateral shoulders, elbows, wrists, hips, knees, ankles. Neurologic: Consistent with history of dementia.  Able to state her first name, but otherwise does not answer questions logically.  No gross focal or lateralizing neurologic deficits are appreciated.  Skin: Skin is warm, dry and intact.  Old ecchymosis to the right side of the face.  Old hematoma to the left lateral thigh. Psychiatric: Mood and affect are appropriate for situation.  EKG  Personally reviewed and interpreted by myself.   Date: 04/02/20 Time: 1011 Rate: 74 Rhythm: sinus Axis: normal Intervals: WNL No STEMI    Radiology  Personally reviewed available imaging myself.   CXR - IMPRESSION:  Lungs clear. Cardiac silhouette within normal limits. Aortic  Atherosclerosis (ICD10-I70.0).   CTH - IMPRESSION:  1. Stable atrophy with stable periventricular small vessel disease.  No acute infarct evident. No hemorrhage.  2. Stable calcified meningioma near the left cerebellopontine angle  arising from the tentorium. No associated edema in this area. No  other mass.  3. Foci of arterial vascular calcification noted.  4. Foci of paranasal sinus disease.  5. Probable cerumen in each external auditory canal.    Procedures  Procedure(s) performed (including critical care):  Procedures   Initial Impression / Assessment and  Plan / MDM / ED Course  85 y.o. female who presents to the ED with increased confusion, less interactive than normal above her baseline dementia.  Ddx: hyperglycemia, electrolyte abnormality, UTI, other infection.  With her history of frequent falls also consider intracranial injury.  Will plan for labs, imaging, urine studies  Clinical Course as of Apr 03 1211  Thu Apr 02, 2020  1037 Labs thus far reveal hyperglycemia, but no gap, normal pH on VBG therefore doubt DKA. Giving fluids. CT head and XR w/o acute abnormalities.    [SM]  1101 UA w/ LE, WBC, and bacteria. Will plan to treat - IV antibiotics and admit. Updated daughter at bedside who is in agreement w/ plan.    [SM]    Clinical Course User Index [  SM] Lilia Pro., MD     _______________________________   As part of my medical decision making I have reviewed available labs, radiology tests, reviewed old records/performed chart review, supplemental history from daughter at bedside who arrives later.    Final Clinical Impression(s) / ED Diagnosis  Final diagnoses:  Altered mental status, unspecified altered mental status type  Hyperglycemia       Note:  This document was prepared using Dragon voice recognition software and may include unintentional dictation errors.   Lilia Pro., MD 04/02/20 1213

## 2020-04-02 NOTE — H&P (Addendum)
History and Physical    Miranda Clay [id number] DOB: 1935/09/06 DOA: 04/02/2020  PCP: [facility]   Patient coming from: Assisted living facility  I have personally briefly reviewed patient's old medical records in [place]  Chief Complaint: Change in mental status Most of the history was obtained from ER notes and patient's daughter at the bedside   HPI: Miranda Clay is a 85 y.o. female with medical history significant for diabetes mellitus, dementia and Hypothyroidism who was brought into the emergency room via EMS for evaluation of change in mental status from her baseline.  She was seen recently in the emergency room for evaluation of frequent falls.  Patient was noted by the physical therapy staff to be more confused than her baseline. Unable to do review of systems due to history of dementia. Labs reveal hyperglycemia with evidence of metabolic acidosis as well as pyuria.  ED Course: Patient brought into the emergency room by EMS for evaluation of mental status changes.  She has dementia but is slightly more confused when compared to her baseline.  She has pyuria and has received IV antibiotics. She will be admitted for further evaluation.  Review of Systems: As per HPI otherwise 10 point review of systems negative.    Past Medical History:  Diagnosis Date  . Dementia ([place])   . Diabetes mellitus without complication ([place])   . Hyperlipidemia   . Hypertension   . Hypothyroidism     History reviewed. No pertinent surgical history.   reports that she has quit smoking. She has never used smokeless tobacco. She reports that she does not drink alcohol or use drugs.  Allergies  Allergen Reactions  . Codeine Nausea And Vomiting    No family history on file.   Prior to Admission medications   Medication Sig Start Date End Date Taking? Authorizing Provider  aspirin EC 81 MG tablet Take 81 mg by mouth daily.    [provider]  Cholecalciferol  (VITAMIN D) 2000 UNITS tablet Take 2,000 Units by mouth daily.    [provider]  glipiZIDE (GLUCOTROL) 10 MG tablet Take 20 mg by mouth 2 (two) times daily.     [provider]  levothyroxine (SYNTHROID, LEVOTHROID) 25 MCG tablet Take 25 mcg by mouth daily before breakfast.    [provider]  lisinopril (ZESTRIL) 5 MG tablet Take 5 mg by mouth daily.    [provider]  memantine (NAMENDA) 10 MG tablet Take 10 mg by mouth 2 (two) times daily.    [provider]  metFORMIN (GLUCOPHAGE) 1000 MG tablet Take 1,000 mg by mouth 2 (two) times daily.     [provider]  ondansetron (ZOFRAN ODT) 4 MG disintegrating tablet Take 1 tablet (4 mg total) by mouth every 8 (eight) hours as needed. 02/11/20   Lavonia Drafts, MD  PARoxetine (PAXIL) 10 MG tablet Take 10 mg by mouth daily.    [provider]  pravastatin (PRAVACHOL) 20 MG tablet Take 20 mg by mouth at bedtime.     [provider]  Rivastigmine 13.3 MG/24HR PT24 Place 1 patch onto the skin daily.    [provider]  traZODone (DESYREL) 50 MG tablet Take 25 mg by mouth 2 (two) times daily as needed (for agitation). In the afternoon between 2-4 pm and at bedtime    [provider]  verapamil (CALAN-SR) 180 MG CR tablet Take 180 mg by mouth daily.    [provider]  Physical Exam: Vitals:   04/02/20 0939 04/02/20 1015 04/02/20 1017 04/02/20 1030  BP:  (!) 120/51  116/60  Pulse:  75    Resp:  12    Temp:   98 F (36.7 C)   TempSrc:   Axillary   SpO2:  94%    Weight: 54 kg        Vitals:   04/02/20 0939 04/02/20 1015 04/02/20 1017 04/02/20 1030  BP:  (!) 120/51  116/60  Pulse:  75    Resp:  12    Temp:   98 F (36.7 C)   TempSrc:   Axillary   SpO2:  94%    Weight: 54 kg       Constitutional: NAD, alert and oriented x 3 Eyes: PERRL, lids and conjunctivae normal, bruise over left eye from a fall ENMT: Mucous membranes are moist.    Neck: normal, supple, no masses, no thyromegaly Respiratory: clear to auscultation bilaterally, no wheezing, no crackles. Normal respiratory effort. No accessory muscle use.  Cardiovascular: Regular rate and rhythm, no murmurs / rubs / gallops. No extremity edema. 2+ pedal pulses. No carotid bruits.  Abdomen: no tenderness, no masses palpated. No hepatosplenomegaly. Bowel sounds positive.  Musculoskeletal: no clubbing / cyanosis. No joint deformity upper and lower extremities. Large hematoma over left thigh Skin: no rashes, lesions, ulcers.  Neurologic: No gross focal neurologic deficit. Psychiatric: Normal mood and affect.   Labs on Admission: I have personally reviewed following labs and imaging studies  CBC: Recent Labs  Lab 04/02/20 0944  WBC 10.9*  NEUTROABS 7.9*  HGB 12.7  HCT 38.6  MCV 94.6  PLT [id number]   Basic Metabolic Panel: Recent Labs  Lab 04/02/20 0944  NA 144  K 4.0  CL 104  CO2 27  GLUCOSE 440*  BUN 24*  CREATININE 0.51  CALCIUM 9.8   GFR: Estimated Creatinine Clearance: 42.9 mL/min (by C-G formula based on SCr of 0.51 mg/dL). Liver Function Tests: No results for input(s): AST, ALT, ALKPHOS, BILITOT, PROT, ALBUMIN in the last 168 hours. No results for input(s): LIPASE, AMYLASE in the last 168 hours. No results for input(s): AMMONIA in the last 168 hours. Coagulation Profile: No results for input(s): INR, PROTIME in the last 168 hours. Cardiac Enzymes: No results for input(s): CKTOTAL, CKMB, CKMBINDEX, TROPONINI in the last 168 hours. BNP (last 3 results) No results for input(s): PROBNP in the last 8760 hours. HbA1C: No results for input(s): HGBA1C in the last 72 hours. CBG: Recent Labs  Lab 04/02/20 0939  GLUCAP 404*   Lipid Profile: No results for input(s): CHOL, HDL, LDLCALC, TRIG, CHOLHDL, LDLDIRECT in the last 72 hours. Thyroid Function Tests: No results for input(s): TSH, T4TOTAL, FREET4, T3FREE, THYROIDAB in the last 72 hours. Anemia  Panel: No results for input(s): VITAMINB12, FOLATE, FERRITIN, TIBC, IRON, RETICCTPCT in the last 72 hours. Urine analysis:    Component Value Date/Time   COLORURINE YELLOW (A) 04/02/2020 1032   APPEARANCEUR CLEAR (A) 04/02/2020 1032   LABSPEC 1.028 04/02/2020 1032   PHURINE 5.0 04/02/2020 1032   GLUCOSEU >=500 (A) 04/02/2020 1032   HGBUR NEGATIVE 04/02/2020 1032   BILIRUBINUR NEGATIVE 04/02/2020 1032   KETONESUR 80 (A) 04/02/2020 1032   PROTEINUR NEGATIVE 04/02/2020 1032   NITRITE NEGATIVE 04/02/2020 1032   LEUKOCYTESUR SMALL (A) 04/02/2020 1032    Radiological Exams on Admission: CT Head Wo Contrast  Result Date: 04/02/2020 CLINICAL DATA:  Confusion/altered mental status EXAM: CT HEAD WITHOUT CONTRAST TECHNIQUE: Contiguous axial  images were obtained from the base of the skull through the vertex without intravenous contrast. COMPARISON:  Mar 27, 2020 FINDINGS: Brain: Moderate diffuse atrophy is stable. Calcified meningioma at the left cerebellopontine angle apparently arising from the tentorium is a stable finding, measuring 2.1 x 1.7 cm. No other evident mass. No hemorrhage, extra-axial fluid collection, or midline shift. There is patchy small vessel disease in the centra semiovale bilaterally. No evident acute infarct. Vascular: No hyperdense vessel. There is calcification in each carotid siphon region. Skull: The bony calvarium appears intact. Sinuses/Orbits: There is opacification in the right maxillary antrum. There is mucosal thickening in multiple ethmoid air cells. Other visualized paranasal sinuses are clear. Visualized orbits appear symmetric bilaterally. Other: Mastoid air cells are clear. There is debris in each external auditory canal. IMPRESSION: 1. Stable atrophy with stable periventricular small vessel disease. No acute infarct evident. No hemorrhage. 2. Stable calcified meningioma near the left cerebellopontine angle arising from the tentorium. No associated edema in this area.  No other mass. 3.  Foci of arterial vascular calcification noted. 4.  Foci of paranasal sinus disease. 5.  Probable cerumen in each external auditory canal. Electronically Signed   By: Lowella Grip III M.D.   On: 04/02/2020 10:22   DG Chest Port 1 View  Result Date: 04/02/2020 CLINICAL DATA:  Altered mental status EXAM: PORTABLE CHEST 1 VIEW COMPARISON:  January 10, 2016 FINDINGS: Lungs are clear. Heart size and pulmonary vascularity are normal. No adenopathy. There is aortic atherosclerosis. No bone lesions. IMPRESSION: Lungs clear. Cardiac silhouette within normal limits. Aortic Atherosclerosis (ICD10-I70.0). Electronically Signed   By: Lowella Grip III M.D.   On: 04/02/2020 10:19    EKG: Independently reviewed.  Sinus rhythm  Assessment/Plan Principal Problem:   Acute lower UTI Active Problems:   Dementia ([place])   Diabetes mellitus with hyperglycemia (H[facility])   Hypothyroidism   Hypertension   UTI (urinary tract infection)   Frequent falls     Acute lower UTI Patient has pyuria and mildly elevated white blood cell count Prior urine culture yielded E. Coli We will place patient empirically on Rocephin 1 g IV daily Follow-up results of urine culture   Diabetes Mellitus with hyperglycemia Patient with significant hyperglycemia with no evidence of metabolic acidosis Gentle IV fluid hydration Place patient on sliding scale coverage   Dementia Patient will need to increase nursing care and assistance with activities of daily living Place patient on fall precautions  Continue Namenda, Paxil, Exelon patch and trazodone Patient is difficult to reorient resulting in multiple falls   Hypothyroidism Continue Synthroid   Frequent falls Place patient on fall precautions Patient has had multiple falls out of her wheel chair and needs frequent reorientation PT evaluate and treat 1:1 safety precautions since patient is high fall risk  DVT prophylaxis: Lovenox Code  Status: Full Family Communication:  Greater than 50% of time was spent discussing patient's condition and plan of care with her daughter at the bedside.  All questions and concerns have been addressed.  CODE STATUS was discussed that she is a DO NOT RESUSCITATE order Disposition Plan: Back to previous home environment Consults called: Physical therapy    Jrake Rodriquez MD [facility]     04/02/2020, 12:29 PM

## 2020-04-02 NOTE — ED Notes (Signed)
Per [place] NP, give sliding scale 5 units at this time. Pt's daughter left, pt placed on bed alarm, pt in close proximity to nurses station.

## 2020-04-02 NOTE — ED Notes (Signed)
Pt given warm blanket.

## 2020-04-02 NOTE — ED Notes (Signed)
Pt daughter left- pt placed on bed alarm and door remains open to promote safety

## 2020-04-02 NOTE — ED Triage Notes (Signed)
Patient from [facility] via [place]. Per staff she has had increasing weakness and AMS for the past 2 days. CBG in 500's. Given 500 ml NS by EMS. RA O2 92%.  Patient placed on 2L [place] by EMS with increase to 95%. Per staff, patient is verbal, but confused at baseline.

## 2020-04-03 ENCOUNTER — Encounter: Payer: Self-pay | Admitting: Internal Medicine

## 2020-04-03 DIAGNOSIS — E43 Unspecified severe protein-calorie malnutrition: Secondary | ICD-10-CM | POA: Insufficient documentation

## 2020-04-03 LAB — BASIC METABOLIC PANEL
Anion gap: 10 (ref 5–15)
BUN: 15 mg/dL (ref 8–23)
CO2: 24 mmol/L (ref 22–32)
Calcium: 8.5 mg/dL — ABNORMAL LOW (ref 8.9–10.3)
Chloride: 112 mmol/L — ABNORMAL HIGH (ref 98–111)
Creatinine, Ser: <0.3 mg/dL — ABNORMAL LOW (ref 0.44–1.00)
Glucose, Bld: 244 mg/dL — ABNORMAL HIGH (ref 70–99)
Potassium: 3.3 mmol/L — ABNORMAL LOW (ref 3.5–5.1)
Sodium: 146 mmol/L — ABNORMAL HIGH (ref 135–145)

## 2020-04-03 LAB — CBC
HCT: 34.1 % — ABNORMAL LOW (ref 36.0–46.0)
Hemoglobin: 10.9 g/dL — ABNORMAL LOW (ref 12.0–15.0)
MCH: 30.6 pg (ref 26.0–34.0)
MCHC: 32 g/dL (ref 30.0–36.0)
MCV: 95.8 fL (ref 80.0–100.0)
Platelets: 299 10*3/uL (ref 150–400)
RBC: 3.56 MIL/uL — ABNORMAL LOW (ref 3.87–5.11)
RDW: 13.7 % (ref 11.5–15.5)
WBC: 7.6 10*3/uL (ref 4.0–10.5)
nRBC: 0 % (ref 0.0–0.2)

## 2020-04-03 LAB — GLUCOSE, CAPILLARY
Glucose-Capillary: 228 mg/dL — ABNORMAL HIGH (ref 70–99)
Glucose-Capillary: 150 mg/dL — ABNORMAL HIGH (ref 70–99)

## 2020-04-03 LAB — URINE CULTURE: Culture: 40000 — AB

## 2020-04-03 LAB — MRSA PCR SCREENING: MRSA by PCR: NEGATIVE

## 2020-04-03 MED ORDER — GLUCERNA SHAKE PO LIQD
237.0000 mL | Freq: Three times a day (TID) | ORAL | Status: DC
  Administered 2020-04-04 (×2): 237 mL via ORAL

## 2020-04-03 MED ORDER — METFORMIN HCL 500 MG PO TABS
1000.0000 mg | ORAL_TABLET | Freq: Two times a day (BID) | ORAL | Status: DC
  Administered 2020-04-03 – 2020-04-04 (×3): 1000 mg via ORAL
  Filled 2020-04-03 (×3): qty 2

## 2020-04-03 MED ORDER — CEPHALEXIN 500 MG PO CAPS
500.0000 mg | ORAL_CAPSULE | Freq: Three times a day (TID) | ORAL | Status: DC
  Administered 2020-04-04 (×2): 500 mg via ORAL
  Filled 2020-04-03 (×2): qty 1

## 2020-04-03 MED ORDER — POTASSIUM CHLORIDE IN NACL 40-0.9 MEQ/L-% IV SOLN
INTRAVENOUS | Status: AC
  Administered 2020-04-03: 100 mL/h via INTRAVENOUS
  Filled 2020-04-03: qty 1000

## 2020-04-03 NOTE — Evaluation (Signed)
Clinical/Bedside Swallow Evaluation Patient Details  Name: Miranda Clay MRN: [id number] Date of Birth: 1935-01-09  Today's Date: 04/03/2020 Time: SLP Start Time (ACUTE ONLY): 1015 SLP Stop Time (ACUTE ONLY): 1115 SLP Time Calculation (min) (ACUTE ONLY): 60 min  Past Medical History:  Past Medical History:  Diagnosis Date  . Dementia ([place])   . Diabetes mellitus without complication ([place])   . Hyperlipidemia   . Hypertension   . Hypothyroidism    Past Surgical History: History reviewed. No pertinent surgical history. HPI:  Pt is a 85 y.o. female with history of dementia, DM, HTN, HLD, hypothyroidism who presents from [facility] assisted living facility via EMS.  Per staff she has been increasingly more weak and less interactive than normal for the last few days.  This morning she woke up, had breakfast, and was noted by physical therapy to not be her normal self, therefore prompting EMS evaluation.  Per report, denied any recent falls or injuries, though she has been seen at [facility] ED recently for frequent falls, one on 03/27/2020.  Per EMS RA O2 92%, placed on 2 LNC.  Glucose in 500s.  Given 500 cc NS.  Per staff, patient is normally verbal and interactive, though confused at baseline.  Pt appears min HOH at baseline. Head CT: stable atrophy, no acute event. CXR: lungs clear.  Assessment / Plan / Recommendation Clinical Impression  Pt appears to present w/ grossly adequate oropharyngeal phase swallow w/ No immediately, apparent pharyngeal phase dysphagia noted. However, pt exhibited min oral phase deficits c/b reduced attention w/ trials of solids(increased texture), increased mastication time/effort, and prolonged oral clearing of bolus residue from oral cavity. Pt demonstrated adequate lingual sweeping to aid oral clearing - pt benefited from verbal/visual cues to do so.Suspect this is directly related to her Baseline Cognitive decline, Dementia dx'd. She is at reduced risk for aspiration when  following general aspiration precautions and supported by NSG staff w/ checking for oral clearing post oral intake/meals.  Pt participated in oral care using swabs w/ support prior to and post oral intake to clean oral cavity; cues provided. Pt consumed po trials w/ No overt, clinical s/s of aspiration during po trials. No overt coughing, decline in vocal quality, or change in respiratory presentation during/post trials. Oral phase appeared [facility] w/ timely bolus management, control of bolus propulsion for A-P transfer for swallowing, and oral clearing w/ trials of thins and purees; min increased time as noted was required w/ increased textured trials of solids. SLP encouraged alternating foods/liquids to aid oral clearing as well. OM Exam appeared [facility] w/ no unilateral weakness noted. Speech Clear, soft. Pt appeared min HOH. Pt fed self w/ setup support. Recommend a Mech Soft consistency diet w/ MINCED meats, moistened foods; Thin liquidsvia Cup/Straw - monitor. Recommend general aspiration precautions, Pills WHOLE vs CRUSHED in Puree for safer, easier swallowing d/t Cognitive decline. Monitoring at meals for oral clearing and toleration of diet, cues. Education given on Pills in Puree; food consistencies and easy to eat options; general aspiration precautions to pt/NSG. NSG to reconsult if any new needs arise. NSG agreed.   SLP Visit Diagnosis: Dysphagia, oral phase (R13.11)    Aspiration Risk  Risk for inadequate nutrition/hydration(reduced following precautions)    Diet Recommendation  Mech Soft diet w/ MINCED meats, gravies added to moisten foods; Thin liquids via cup/straw. Monitoring at all meals for toleration of diet, and oral clearing post intake/meals. General aspiration precautions. Dietician support w/ Drink Supplement.  Medication Administration: Crushed with  puree(vs Whole w/ puree as able)    Other  Recommendations Recommended Consults: (Dietician consult for support) Oral Care  Recommendations: Oral care BID;Oral care before and after PO;Staff/trained caregiver to provide oral care;Patient independent with oral care Other Recommendations: (n/a)   Follow up Recommendations None      Frequency and Duration (n/a)  (n/a)       Prognosis Prognosis for Safe Diet Advancement: Good Barriers to Reach Goals: Cognitive deficits;Time post onset;Severity of deficits      Swallow Study   General Date of Onset: 04/02/20 HPI: Pt is a 85 y.o. female with history of dementia, DM, HTN, HLD, hypothyroidism who presents from [facility] assisted living facility via EMS.  Per staff she has been increasingly more weak and less interactive than normal for the last few days.  This morning she woke up, had breakfast, and was noted by physical therapy to not be her normal self, therefore prompting EMS evaluation.  Per report, denied any recent falls or injuries, though she has been seen at [facility] ED recently for frequent falls, one on 03/27/2020.  Per EMS RA O2 92%, placed on 2 LNC.  Glucose in 500s.  Given 500 cc NS.  Per staff, patient is normally verbal and interactive, though confused at baseline.  Pt appears min HOH at baseline. Type of Study: Bedside Swallow Evaluation Previous Swallow Assessment: none noted Diet Prior to this Study: Regular;Thin liquids Temperature Spikes Noted: No(wbc 7.6) Respiratory Status: Nasal cannula(2L) History of Recent Intubation: No Behavior/Cognition: Alert;Cooperative;Pleasant mood;Confused;Distractible;Requires cueing Oral Cavity Assessment: Dried secretions(dried, sticky food debris) Oral Care Completed by SLP: Yes Oral Cavity - Dentition: Adequate natural dentition Vision: Functional for self-feeding Self-Feeding Abilities: Able to feed self;Needs assist;Needs set up;Total assist(d/t cognitive decline) Patient Positioning: Upright in bed(needed positioning support) Baseline Vocal Quality: Low vocal intensity(adequate) Volitional Cough:  Strong Volitional Swallow: Able to elicit    Oral/Motor/Sensory Function Overall Oral Motor/Sensory Function: Within functional limits   Ice Chips Ice chips: Within functional limits Presentation: Spoon(fed; 2 trials)   Thin Liquid Thin Liquid: Within functional limits Presentation: Cup;Self Fed;Straw(~8+ ozs total)    Nectar Thick Nectar Thick Liquid: Not tested   Honey Thick Honey Thick Liquid: Not tested   Puree Puree: Within functional limits Presentation: Self Fed;Spoon(supported; 8 trials)   Solid     Solid: Impaired Presentation: Self Fed;Spoon(supported, fed; 6 trials) Oral Phase Impairments: Impaired mastication;Poor awareness of bolus Oral Phase Functional Implications: Impaired mastication;Prolonged oral transit Pharyngeal Phase Impairments: (none) Other Comments: pt demonstrated adequate lingual sweeping to aid oral clearing - pt benefited from verbal/visual cues to do so       Orinda Kenner, MS, CCC-SLP [place] Sweetin 04/03/2020,12:05 PM

## 2020-04-03 NOTE — TOC Initial Note (Addendum)
Transition of Care [facility]) - Initial/Assessment Note    Patient Details  Name: Miranda Clay MRN: [id number] Date of Birth: Apr 06, 1935  [facility] CM/SW Contact:    Elease Hashimoto, LCSW Phone Number: 04/03/2020, 3:34 PM  Clinical Narrative:  Spoke with daughter-Anna via telephone to discuss getting back to baseline and MD plans for her to return to [facility]. She voiced she is agreeable to this. She is benefiting from IV fluids and antibiotics here. Will get PT follow up at [facility] with MD order in DC summary. Have completed FL2 and spoken with Vicotr-Med Tech @ [facility] regarding pt returning tomorrow. Daughter cares for her two young grandchildren daily and will be here later today to see pt. Pt is very calm laying in the bed and watching the people in the hallway. Will inform weekend staff regarding pt returning and gather paperwork for DC back to memory care unit.                 3:48 PM Spoke with [facility] and he will take all paperwork together when pt come back tomorrow. He wants her send EMS. Will let weekend coverage know regarding this   Expected Discharge Plan: Memory Care Barriers to Discharge: Continued Medical Work up   Patient Goals and CMS Choice Patient states their goals for this hospitalization and ongoing recovery are:: Daughter is good with her staybign tonight and getting fluids and going back tomorrow      Expected Discharge Plan and Services Expected Discharge Plan: Memory Care In-house Referral: Clinical Social Work     Living arrangements for the past 2 months: Assisted Living Facility(Memory care)                                      Prior Living Arrangements/Services Living arrangements for the past 2 months: Assisted Living Facility(Memory care) Lives with:: Facility Resident Patient language and need for interpreter reviewed:: No Do you feel safe going back to the place where you live?: Yes       Need for Family Participation in Patient Care: No (Comment) Care giver support system in place?: Yes (comment)   Criminal Activity/Legal Involvement Pertinent to Current Situation/Hospitalization: No - Comment as needed  Activities of Daily Living      Permission Sought/Granted Permission sought to share information with : Facility Sport and exercise psychologist, Family Supports Permission granted to share information with : Yes, Verbal Permission Granted  Share Information with NAME: [place]  Permission granted to share info w AGENCY: [employer] granted to share info w Relationship: daughter  Permission granted to share info w Contact Information: [place]-med tech  Emotional Assessment Appearance:: Appears stated age Attitude/Demeanor/Rapport: Unable to Assess(only oriented to self) Affect (typically observed): Calm Orientation: : Oriented to Self   Psych Involvement: No (comment)  Admission diagnosis:  UTI (urinary tract infection) [N39.0] Hyperglycemia [R73.9] Altered mental status, unspecified altered mental status type [R41.82] Patient Active Problem List   Diagnosis Date Noted  . Protein-calorie malnutrition, severe 04/03/2020  . Acute lower UTI 04/02/2020  . UTI (urinary tract infection) 04/02/2020  . Dementia ([place])   . Diabetes mellitus with hyperglycemia ([place])   . Hypothyroidism   . Hypertension   . Frequent falls    PCP:  [facility] Pharm[facility]:   [facility]  [place] [address] [place] [address] Phone: [phone] Fax: [phone]     Social Determinants of Health (SDOH) Interventions    Readmission Risk Interventions No flowsheet data found.

## 2020-04-03 NOTE — NC FL2 (Signed)
[place] LEVEL OF CARE SCREENING TOOL     IDENTIFICATION  Patient Name: Miranda Clay Birthdate: 1935-10-03 Sex: female Admission Date (Current Location): 04/02/2020  [place] and [place] Number:  Engineering geologist and Address:  [facility], [address], [place], [place] [address]      Provider Number: [id number]  Attending Physician Name and Address:  Edwin Dada, *  Relative Name and Phone Number:  Amrie Hunstad [phone]    Current Level of Care: Hospital Recommended Level of Care: Memory Care Prior Approval Number:    Date Approved/Denied:   PASRR Number:    Discharge Plan: Other (Comment)(Memory Care)    Current Diagnoses: Patient Active Problem List   Diagnosis Date Noted  . Acute lower UTI 04/02/2020  . UTI (urinary tract infection) 04/02/2020  . Dementia ([place])   . Diabetes mellitus with hyperglycemia ([place])   . Hypothyroidism   . Hypertension   . Frequent falls     Orientation RESPIRATION BLADDER Height & Weight     Self, Place  Normal External catheter Weight: 119 lb 0.8 oz (54 kg) Height:     BEHAVIORAL SYMPTOMS/MOOD NEUROLOGICAL BOWEL NUTRITION STATUS      Continent Diet(Carb Modified mech soft-thin liquids)  AMBULATORY STATUS COMMUNICATION OF NEEDS Skin   Limited Assist Verbally Normal                       Personal Care Assistance Level of Assistance  Bathing, Feeding Bathing Assistance: Limited assistance Feeding assistance: Limited assistance       Functional Limitations Info             SPECIAL CARE FACTORS FREQUENCY  PT (By licensed PT)     PT Frequency: 3x week              Contractures Contractures Info: Not present    Additional Factors Info  Code Status, Allergies Code Status Info: DNR Allergies Info: Codeine           Current Medications (04/03/2020):  This is the current hospital active medication list Current Facility-Administered Medications   Medication Dose Route Frequency Provider Last Rate Last Admin  . 0.9 % NaCl with KCl 40 mEq / L  infusion   Intravenous Continuous Edwin Dada, MD 100 mL/hr at 04/03/20 1020 100 mL/hr at 04/03/20 1020  . acetaminophen (TYLENOL) tablet 650 mg  650 mg Oral Q6H PRN Agbata, Tochukwu, MD       Or  . acetaminophen (TYLENOL) suppository 650 mg  650 mg Rectal Q6H PRN Agbata, Tochukwu, MD      . aspirin EC tablet 81 mg  81 mg Oral Daily Agbata, Tochukwu, MD   81 mg at 04/03/20 0845  . cholecalciferol (VITAMIN D3) tablet 2,000 Units  2,000 Units Oral Daily Agbata, Tochukwu, MD   2,000 Units at 04/03/20 0845  . enoxaparin (LOVENOX) injection 40 mg  40 mg Subcutaneous Q24H Agbata, Tochukwu, MD      . insulin aspart (novoLOG) injection 0-15 Units  0-15 Units Subcutaneous TID WC Agbata, Tochukwu, MD   3 Units at 04/03/20 1412  . levothyroxine (SYNTHROID) tablet 25 mcg  25 mcg Oral Q0600 Agbata, Tochukwu, MD      . lisinopril (ZESTRIL) tablet 5 mg  5 mg Oral Daily Agbata, Tochukwu, MD   5 mg at 04/03/20 0847  . memantine (NAMENDA) tablet 10 mg  10 mg Oral BID Agbata, Tochukwu, MD   10 mg at  04/03/20 0845  . metFORMIN (GLUCOPHAGE) tablet 1,000 mg  1,000 mg Oral BID WC Edwin Dada, MD   1,000 mg at 04/03/20 1114  . ondansetron (ZOFRAN) tablet 4 mg  4 mg Oral Q6H PRN Agbata, Tochukwu, MD       Or  . ondansetron (ZOFRAN) injection 4 mg  4 mg Intravenous Q6H PRN Agbata, Tochukwu, MD      . PARoxetine (PAXIL) tablet 10 mg  10 mg Oral Daily Agbata, Tochukwu, MD   10 mg at 04/03/20 0849  . pravastatin (PRAVACHOL) tablet 20 mg  20 mg Oral QHS Agbata, Tochukwu, MD      . rivastigmine (EXELON) 4.6 mg/24hr 4.6 mg  4.6 mg Transdermal Daily Agbata, Tochukwu, MD   4.6 mg at 04/03/20 0851   And  . rivastigmine (EXELON) 9.5 mg/24hr 9.5 mg  9.5 mg Transdermal Daily Agbata, Tochukwu, MD   9.5 mg at 04/03/20 0851  . traZODone (DESYREL) tablet 25 mg  25 mg Oral BID PRN Agbata, Tochukwu, MD      . verapamil  (CALAN-SR) CR tablet 180 mg  180 mg Oral Daily Agbata, Tochukwu, MD   180 mg at 04/03/20 1416     Discharge Medications: Please see discharge summary for a list of discharge medications.  Relevant Imaging Results:  Relevant Lab Results:   Additional Information SSN; [id number]  Katilynn Sinkler, Gardiner Rhyme, LCSW

## 2020-04-03 NOTE — Progress Notes (Signed)
Initial Nutrition Assessment  DOCUMENTATION CODES:   Severe malnutrition in context of social or environmental circumstances  INTERVENTION:  Provide Glucerna Shake po TID, each supplement provides 220 kcal and 10 grams of protein.  NUTRITION DIAGNOSIS:   Severe Malnutrition related to social / environmental circumstances(dementia, inadequate oral intake) as evidenced by severe fat depletion, moderate muscle depletion, severe muscle depletion.  GOAL:   Patient will meet greater than or equal to 90% of their needs  MONITOR:   PO intake, Supplement acceptance, Labs, Weight trends, I & O's  REASON FOR ASSESSMENT:   Consult Poor PO  ASSESSMENT:   85 year old female with PMHx of dementia, DM, hypothyroidism, HLD, HTN admitted with UTI, also with frequent falls recently.   Met with patient at bedside but she was unable to provide any history. Per RN patient only ate about 5 bites of her lunch today. Spoke with patient's daughter over the phone. She reports patient resides at [facility] unit. She reports patient typically has a fairly good appetite. She reports patient does not do well with drinking water on her own and has to be encouraged to drink fluids. Discussed that patient is not eating much of her meals here. Daughter reports patient would likely accept a oral nutrition supplement.  Daughter reports patient is not losing weight and is weight-stable at 115-117 lbs. According to chart patient is documented to be 54 kg (119.05 lbs) but suspect this was just copied forward as 3 previous weights in chart were all exactly 54 kg.  Medications reviewed and include: vitamin D3 2000 units, Novolog 0-15 units TID, levothyroxine, lisinopril, metformin 1000 mg BID, NS with KCl 40 mEq/L at 100 mL/hr.  Labs reviewed: CBG 227-248, Sodium 146, Potassium 3.3, Chloride 112, Creatinine <0.3.  NUTRITION - FOCUSED PHYSICAL EXAM:    Most Recent Value  Orbital Region  Severe depletion   Upper Arm Region  Severe depletion  Thoracic and Lumbar Region  Moderate depletion  Buccal Region  Severe depletion  Temple Region  Severe depletion  Clavicle Bone Region  Severe depletion  Clavicle and Acromion Bone Region  Severe depletion  Scapular Bone Region  Unable to assess  Dorsal Hand  Severe depletion  Patellar Region  Moderate depletion  Anterior Thigh Region  Moderate depletion  Posterior Calf Region  Severe depletion  Edema (RD Assessment)  None  Hair  Reviewed  Eyes  Reviewed  Mouth  Reviewed  Skin  Reviewed  Nails  Reviewed     Diet Order:   Diet Order            Diet Carb Modified Fluid consistency: Thin; Room service appropriate? Yes with Assist  Diet effective now             EDUCATION NEEDS:   No education needs have been identified at this time  Skin:  Skin Assessment: Reviewed RN Assessment  Last BM:  Unknown  Height:   Ht Readings from Last 1 Encounters:  02/11/20 5' 2"  (1.575 m)   Weight:   Wt Readings from Last 1 Encounters:  04/02/20 54 kg   BMI:  Body mass index is 21.77 kg/m.  Estimated Nutritional Needs:   Kcal:  1300-1500  Protein:  65-75 grams  Fluid:  1.3-1.5 L/day  Jacklynn Barnacle, MS, RD, LDN Pager number available on Amion

## 2020-04-03 NOTE — Progress Notes (Signed)
PROGRESS NOTE    Miranda Clay  [id number] DOB: [birth year] DOA: 04/02/2020 PCP: Orvis Brill, [facility]      Brief Narrative:  Miranda Clay is a 85 y.o. F with dementia, lives in memory care, falls, DM, HTN, and hypothyroidism who presented with 2 days generalized weakness, decreased responsiveness.  In the ER, afebrile, WBC normal.  Elevated BUN-creatinine ratio, glucose 440.  Urine with 11-20 WBC.  No urinary symptoms.  Started on antibiotics and admitted for UTI.         Assessment & Plan:  Acute metabolic encephalopathy superimposed on dementia Likely due to hyperglycemia, dehydration.  No signs to suggest UTI.  Mentation today still not at baseline (patient too cognitively impaired and inattentive to swallow with SLP, had trouble following commands with PT and was impulsive, was overall subdued and less interactive than baseline per daughter).  -Continue IV fluids -PT eval -Transition to oral antibiotics -Check TSH   -Continue memantine, rivastigmine   Diabetes Glucose 440 on admission without DKA.  Glucoses improved but still >200. -Continue SS corrections -Continue fluids -Continue aspirin, pravastatin -Resume metformin  Hypertension BP labile but mostly controlled -Continue lisinopril, verapamil  Hypothyroidism -Continue levothyroxine -Check TSH  Hypokalemia -Supplement K        Disposition: Status is: Inpatient  Remains inpatient appropriate because:the patient requires ongoing IV fluids to treat dehydration   Dispo: The patient is from: ALF              Anticipated d/c is to: ALF              Anticipated d/c date is: 1 day              Patient currently is not medically stable to d/c.               MDM: The below labs and imaging reports were reviewed and summarized above.  Medication management as above.       DVT prophylaxis: Lovenox Code Status: DNR Family Communication: Daughter by phone    Consultants:      Procedures:     Antimicrobials:   Ceftriaxone   Culture data:   5/13 urine culture pending           Subjective: The patient has no complaints.  She is weak.  No fever overnight.  Nursing report that she is picking at lines, confused.  Objective: Vitals:   04/02/20 2200 04/02/20 2230 04/02/20 2300 04/02/20 2330  BP: (!) 127/55 (!) 139/57 123/66 (!) 150/63  Pulse: 75 87 86 79  Resp: 13 13 13 17   Temp:      TempSrc:      SpO2: 90% 93% 94% 93%  Weight:        Intake/Output Summary (Last 24 hours) at 04/03/2020 0827 Last data filed at 04/02/2020 2054 Gross per 24 hour  Intake 1100 ml  Output 650 ml  Net 450 ml   Filed Weights   04/02/20 0939  Weight: 54 kg    Examination: General appearance: Thin elderly adult female, awake, makes eye contact, but dazed.   HEENT: Anicteric, conjunctiva pink, lids and lashes normal. No nasal deformity, discharge, epistaxis.  Lips moist, oropharynx moist, no oral lesions, dentition normal, hearing diminished.   Skin: Warm and dry.  No jaundice.  No suspicious rashes or lesions. Cardiac: RRR, nl S1-S2, no murmurs appreciated.  Capillary refill is brisk.  JVP normal.  No LE edema.  Radial pulses 2+ and symmetric. Respiratory: Normal respiratory rate and  rhythm.  CTAB without rales or wheezes. Abdomen: Abdomen soft.  No TTP or guarding. No ascites, distension, hepatosplenomegaly.   MSK: No deformities or effusions.  Diffuse severe loss of subcutaneous muscle mass and fat Neuro: Awake makes eye contact.  EOMI, moves all extremities with severe generalized weakness. Speech slow but fluent.    Psych: Makes eye contact, but attention diminished, affect blunted, judgment appears severely impaired        Data Reviewed: I have personally reviewed following labs and imaging studies:  CBC: Recent Labs  Lab 04/02/20 0944 04/03/20 0524  WBC 10.9* 7.6  NEUTROABS 7.9*  --   HGB 12.7 10.9*  HCT 38.6 34.1*  MCV 94.6 95.8  PLT  399 [id number]   Basic Metabolic Panel: Recent Labs  Lab 04/02/20 0944 04/03/20 0524  NA 144 146*  K 4.0 3.3*  CL 104 112*  CO2 27 24  GLUCOSE 440* 244*  BUN 24* 15  CREATININE 0.51 <0.30*  CALCIUM 9.8 8.5*   GFR: CrCl cannot be calculated (This lab value cannot be used to calculate CrCl because it is not a number: <0.30). Liver Function Tests: No results for input(s): AST, ALT, ALKPHOS, BILITOT, PROT, ALBUMIN in the last 168 hours. No results for input(s): LIPASE, AMYLASE in the last 168 hours. No results for input(s): AMMONIA in the last 168 hours. Coagulation Profile: No results for input(s): INR, PROTIME in the last 168 hours. Cardiac Enzymes: No results for input(s): CKTOTAL, CKMB, CKMBINDEX, TROPONINI in the last 168 hours. BNP (last 3 results) No results for input(s): PROBNP in the last 8760 hours. HbA1C: Recent Labs    04/02/20 1225  HGBA1C 9.8*   CBG: Recent Labs  Lab 04/02/20 0939 04/02/20 2043 04/02/20 2247  GLUCAP 404* 248* 227*   Lipid Profile: No results for input(s): CHOL, HDL, LDLCALC, TRIG, CHOLHDL, LDLDIRECT in the last 72 hours. Thyroid Function Tests: No results for input(s): TSH, T4TOTAL, FREET4, T3FREE, THYROIDAB in the last 72 hours. Anemia Panel: No results for input(s): VITAMINB12, FOLATE, FERRITIN, TIBC, IRON, RETICCTPCT in the last 72 hours. Urine analysis:    Component Value Date/Time   COLORURINE YELLOW (A) 04/02/2020 1032   APPEARANCEUR CLEAR (A) 04/02/2020 1032   LABSPEC 1.028 04/02/2020 1032   PHURINE 5.0 04/02/2020 1032   GLUCOSEU >=500 (A) 04/02/2020 1032   HGBUR NEGATIVE 04/02/2020 [place] 04/02/2020 1032   KETONESUR 80 (A) 04/02/2020 1032   PROTEINUR NEGATIVE 04/02/2020 1032   NITRITE NEGATIVE 04/02/2020 1032   LEUKOCYTESUR SMALL (A) 04/02/2020 1032   Sepsis Labs: @LABRCNTIP (procalcitonin:4,lacticacidven:4)  ) Recent Results (from the past 240 hour(s))  SARS Coronavirus 2 by RT PCR (hospital order,  performed in [facility] hospital lab) Nasopharyngeal Nasopharyngeal Swab     Status: None   Collection Time: 04/02/20 11:22 AM   Specimen: Nasopharyngeal Swab  Result Value Ref Range Status   SARS Coronavirus 2 NEGATIVE NEGATIVE Final    Comment: (NOTE) SARS-CoV-2 target nucleic acids are NOT DETECTED. The SARS-CoV-2 RNA is generally detectable in upper and lower respiratory specimens during the acute phase of infection. The lowest concentration of SARS-CoV-2 viral copies this assay can detect is 250 copies / mL. A negative result does not preclude SARS-CoV-2 infection and should not be used as the sole basis for treatment or other patient management decisions.  A negative result may occur with improper specimen collection / handling, submission of specimen other than nasopharyngeal swab, presence of viral mutation(s) within the areas targeted by this assay,  and inadequate number of viral copies (<250 copies / mL). A negative result must be combined with clinical observations, patient history, and epidemiological information. Fact Sheet for Patients:   StrictlyIdeas.no Fact Sheet for Healthcare Providers: BankingDealers.co.za This test is not yet approved or cleared  by the [place] FDA and has been authorized for detection and/or diagnosis of SARS-CoV-2 by FDA under an Emergency Use Authorization (EUA).  This EUA will remain in effect (meaning this test can be used) for the duration of the COVID-19 declaration under Section 564(b)(1) of the Act, 21 U.S.C. section 360bbb-3(b)(1), unless the authorization is terminated or revoked sooner. Performed at [facility], [address], [place], [place] [address]          Radiology Studies: CT Head Wo Contrast  Result Date: 04/02/2020 CLINICAL DATA:  Confusion/altered mental status EXAM: CT HEAD WITHOUT CONTRAST TECHNIQUE: Contiguous axial images were obtained from the  base of the skull through the vertex without intravenous contrast. COMPARISON:  Mar 27, 2020 FINDINGS: Brain: Moderate diffuse atrophy is stable. Calcified meningioma at the left cerebellopontine angle apparently arising from the tentorium is a stable finding, measuring 2.1 x 1.7 cm. No other evident mass. No hemorrhage, extra-axial fluid collection, or midline shift. There is patchy small vessel disease in the centra semiovale bilaterally. No evident acute infarct. Vascular: No hyperdense vessel. There is calcification in each carotid siphon region. Skull: The bony calvarium appears intact. Sinuses/Orbits: There is opacification in the right maxillary antrum. There is mucosal thickening in multiple ethmoid air cells. Other visualized paranasal sinuses are clear. Visualized orbits appear symmetric bilaterally. Other: Mastoid air cells are clear. There is debris in each external auditory canal. IMPRESSION: 1. Stable atrophy with stable periventricular small vessel disease. No acute infarct evident. No hemorrhage. 2. Stable calcified meningioma near the left cerebellopontine angle arising from the tentorium. No associated edema in this area. No other mass. 3.  Foci of arterial vascular calcification noted. 4.  Foci of paranasal sinus disease. 5.  Probable cerumen in each external auditory canal. Electronically Signed   By: Lowella Grip III M.D.   On: 04/02/2020 10:22   DG Chest Port 1 View  Result Date: 04/02/2020 CLINICAL DATA:  Altered mental status EXAM: PORTABLE CHEST 1 VIEW COMPARISON:  January 10, 2016 FINDINGS: Lungs are clear. Heart size and pulmonary vascularity are normal. No adenopathy. There is aortic atherosclerosis. No bone lesions. IMPRESSION: Lungs clear. Cardiac silhouette within normal limits. Aortic Atherosclerosis (ICD10-I70.0). Electronically Signed   By: Lowella Grip III M.D.   On: 04/02/2020 10:19        Scheduled Meds: . aspirin EC  81 mg Oral Daily  . cholecalciferol   2,000 Units Oral Daily  . enoxaparin (LOVENOX) injection  40 mg Subcutaneous Q24H  . insulin aspart  0-15 Units Subcutaneous TID WC  . levothyroxine  25 mcg Oral Q0600  . lisinopril  5 mg Oral Daily  . memantine  10 mg Oral BID  . PARoxetine  10 mg Oral Daily  . pravastatin  20 mg Oral QHS  . rivastigmine  4.6 mg Transdermal Daily   And  . rivastigmine  9.5 mg Transdermal Daily  . verapamil  180 mg Oral Daily   Continuous Infusions: . sodium chloride 100 mL/hr at 04/03/20 0302  . cefTRIAXone (ROCEPHIN)  IV       LOS: 1 day    Time spent: 25 minutes    Edwin Dada, MD [facility] 04/03/2020, 8:27 AM  Please page though [facility] or Epic secure chat:  For [employer], Adult nurse

## 2020-04-03 NOTE — Evaluation (Signed)
Physical Therapy Evaluation Patient Details Name: Miranda Clay MRN: [id number] DOB: 1934-12-23 Today's Date: 04/03/2020   History of Present Illness  Miranda Clay is an 13yoF who comes to [facility] on 5/13 from [facility] due to increasing weakness and decreased interactiveness with staff. Pt noted to have acute UTI. PMH: dementia, DM, HTN, HLD, hypoTSH. Pt has been seen here multiple times for falls. Author contacted DTR on phone who reports pt does not AMB unassisted anymore, but is generally able to AMB with handheld assist for limited household distances, uses a WC for longer mobility in facility.   Clinical Impression  Pt admitted with above diagnosis. Pt currently with functional limitations due to the deficits listed below (see "PT Problem List"). Upon entry, pt in bed, awake and agreeable to participate. Safety mittens removed for session. The is calm, interactive, follows multimodal cues with intermittent need for additional processing time. Functional mobility assessment demonstrates increased effort/time requirements, good tolerance, and need for physical assistance, overall performance not too far off baseline with exception to inability to correct postural lean. Pt will benefit from skilled PT intervention to increase independence and safety with basic mobility in preparation for discharge to the venue listed below.       Follow Up Recommendations Home health PT;Supervision/Assistance - 24 hour;Supervision for mobility/OOB    Equipment Recommendations  None recommended by PT    Recommendations for Other Services       Precautions / Restrictions Precautions Precautions: Fall Precaution Comments: dementia, baseline confusion, needs help with feeding Restrictions Weight Bearing Restrictions: No      Mobility  Bed Mobility Overal bed mobility: Needs Assistance Bed Mobility: Supine to Sit;Sit to Supine     Supine to sit: Min guard Sit to supine: Min guard   General bed mobility  comments: follows multimodal cues well, low impulsivity  Transfers Overall transfer level: Needs assistance Equipment used: 1 person hand held assist Transfers: Sit to/from Stand Sit to Stand: Min assist         General transfer comment: needs support from LUE and gait belt for rise to standing, never able to correc tposterior lean, even when attempting FWD/BWD AMB  Ambulation/Gait Ambulation/Gait assistance: Max assist Gait Distance (Feet): 2 Feet Assistive device: 1 person hand held assist       General Gait Details: continuous posterior lean, poor awareness, does best with multimodal cues; not safe to progress  Stairs            Wheelchair Mobility    Modified Rankin (Stroke Patients Only)       Balance Overall balance assessment: History of Falls;Needs assistance Sitting-balance support: No upper extremity supported;Feet unsupported Sitting balance-Leahy Scale: Good     Standing balance support: Single extremity supported;During functional activity Standing balance-Leahy Scale: Zero                               Pertinent Vitals/Pain Pain Assessment: No/denies pain(denies but later c/o back pain with assistance to EOB)    Home Living Family/patient expects to be discharged to:: Assisted living([facility] x5Y)                 Additional Comments: has been working with PT/OT at [facility] (memory care).    Prior Function Level of Independence: Needs assistance   Gait / Transfers Assistance Needed: Per DTR pt is not cleared for unassisted AMB, but does well with assisted walking. Staff AMB pt with  physical assist of trunk/arms. More recently has been AMB in-room distances (<160ft) +1 for STS from [facility].  ADL's / Homemaking Assistance Needed: Requires assistance/cues for ADL performance.  Comments: DTR feels that pt may need a higher level of care, as dementia is more than a memory issue, but now needs more physical assistance.      Hand Dominance        Extremity/Trunk Assessment   Upper Extremity Assessment Upper Extremity Assessment: Generalized weakness    Lower Extremity Assessment Lower Extremity Assessment: Generalized weakness    Cervical / Trunk Assessment Cervical / Trunk Assessment: Kyphotic  Communication      Cognition Arousal/Alertness: Awake/alert Behavior During Therapy: WFL for tasks assessed/performed Overall Cognitive Status: Within Functional Limits for tasks assessed                                        General Comments      Exercises     Assessment/Plan    PT Assessment Patient needs continued PT services  PT Problem List Decreased strength;Decreased range of motion;Decreased activity tolerance;Decreased balance;Decreased mobility;Decreased cognition;Decreased safety awareness;Decreased knowledge of use of DME;Decreased knowledge of precautions       PT Treatment Interventions Functional mobility training;Therapeutic activities;Therapeutic exercise;Patient/family education;Cognitive remediation;Neuromuscular re-education;Balance training    PT Goals (Current goals can be found in the Care Plan section)  Acute Rehab PT Goals Patient Stated Goal: maintain PLOF in transfers and short AMB PT Goal Formulation: With family Time For Goal Achievement: 04/17/20 Potential to Achieve Goals: Fair    Frequency Min 2X/week   Barriers to discharge Decreased caregiver support needs a higher level of care at this point (several recent falls, now needs help attending to the task of eating)    Co-evaluation               AM-PAC PT "6 Clicks" Mobility  Outcome Measure Help needed turning from your back to your side while in a flat bed without using bedrails?: None Help needed moving from lying on your back to sitting on the side of a flat bed without using bedrails?: A Little Help needed moving to and from a bed to a chair (including a wheelchair)?: A  Lot Help needed standing up from a chair using your arms (e.g., wheelchair or bedside chair)?: A Lot Help needed to walk in hospital room?: A Lot Help needed climbing 3-5 steps with a railing? : Total 6 Click Score: 14    End of Session Equipment Utilized During Treatment: Gait belt Activity Tolerance: Patient tolerated treatment well;No increased pain Patient left: in bed;with nursing/sitter in room;with call bell/phone within reach Nurse Communication: Mobility status PT Visit Diagnosis: Unsteadiness on feet (R26.81);Other abnormalities of gait and mobility (R26.89);Repeated falls (R29.6);History of falling (Z91.81);Difficulty in walking, not elsewhere classified (R26.2)    Time: [id number] PT Time Calculation (min) (ACUTE ONLY): 13 min   Charges:   PT Evaluation $PT Eval Moderate Complexity: 1 Mod          12:04 PM, 04/03/20 Etta Grandchild, PT, DPT Physical Therapist - [facility]  [phone] ([place])    [place] 04/03/2020, 12:01 PM

## 2020-04-04 LAB — BASIC METABOLIC PANEL
Anion gap: 9 (ref 5–15)
BUN: 12 mg/dL (ref 8–23)
CO2: 25 mmol/L (ref 22–32)
Calcium: 8.4 mg/dL — ABNORMAL LOW (ref 8.9–10.3)
Chloride: 106 mmol/L (ref 98–111)
Creatinine, Ser: 0.36 mg/dL — ABNORMAL LOW (ref 0.44–1.00)
GFR calc Af Amer: >60 mL/min (ref >60)
GFR calc non Af Amer: >60 mL/min (ref >60)
Glucose, Bld: 264 mg/dL — ABNORMAL HIGH (ref 70–99)
Potassium: 3.2 mmol/L — ABNORMAL LOW (ref 3.5–5.1)
Sodium: 140 mmol/L (ref 135–145)

## 2020-04-04 LAB — CBC
HCT: 33.8 % — ABNORMAL LOW (ref 36.0–46.0)
Hemoglobin: 11.4 g/dL — ABNORMAL LOW (ref 12.0–15.0)
MCH: 31.3 pg (ref 26.0–34.0)
MCHC: 33.7 g/dL (ref 30.0–36.0)
MCV: 92.9 fL (ref 80.0–100.0)
Platelets: 295 10*3/uL (ref 150–400)
RBC: 3.64 MIL/uL — ABNORMAL LOW (ref 3.87–5.11)
RDW: 13.5 % (ref 11.5–15.5)
WBC: 10.8 10*3/uL — ABNORMAL HIGH (ref 4.0–10.5)
nRBC: 0 % (ref 0.0–0.2)

## 2020-04-04 LAB — GLUCOSE, CAPILLARY
Glucose-Capillary: 256 mg/dL — ABNORMAL HIGH (ref 70–99)
Glucose-Capillary: 165 mg/dL — ABNORMAL HIGH (ref 70–99)

## 2020-04-04 MED ORDER — CEPHALEXIN 500 MG PO CAPS: 500.0000 mg | ORAL_CAPSULE | Freq: Three times a day (TID) | ORAL | 0 refills | Status: DC

## 2020-04-04 MED ORDER — INSULIN GLARGINE 100 UNIT/ML ~~LOC~~ SOLN
6.0000 [IU] | Freq: Every day | SUBCUTANEOUS | 3 refills | Status: DC
Start: 2020-04-04 — End: 2020-04-04

## 2020-04-04 MED ORDER — INSULIN GLARGINE 100 UNIT/ML ~~LOC~~ SOLN: 6.0000 [IU] | Freq: Every day | SUBCUTANEOUS | 3 refills | Status: AC

## 2020-04-04 MED ORDER — POTASSIUM CHLORIDE CRYS ER 20 MEQ PO TBCR
20.0000 meq | EXTENDED_RELEASE_TABLET | Freq: Once | ORAL | Status: AC
  Administered 2020-04-04: 20 meq via ORAL
  Filled 2020-04-04 (×2): qty 1

## 2020-04-04 NOTE — Progress Notes (Signed)
Sylvarena and gave report to director Cathlean Sauer

## 2020-04-04 NOTE — Progress Notes (Signed)
Patient has had continued attempts to try and get out of bed. Patient continues to need to urinate however she is not understanding she has a pure wick in place. At this time I have asked provider for tele sitter. As nurse and aid have been in patient room several times over the hour and she is not understanding she will need to stay in bed. Floor mats are also in place at this time. Safety checks completed and call light placed within reach will continue to monitor.

## 2020-04-04 NOTE — Progress Notes (Signed)
Attempted to set up tele sitter however they have stated no order at this time. State they will attempt to call me when they receive. Patient appears to be sleeping at this time. Safety checks completed and call light placed within reach.

## 2020-04-04 NOTE — Discharge Summary (Signed)
Physician Discharge Summary  Kairah Brinkmeyer [id number] DOB: 1935/01/30 DOA: 04/02/2020  PCP: Orvis Brill, [facility]  Admit date: 04/02/2020 Discharge date: 04/04/2020  Admitted From: ALF  Disposition:  ALF with [facility]   Recommendations for Outpatient Follow-up:  1. Follow up with PCP in 1 week 2. Please start insulin glargine 6 units daily and check glucose twice daily; fax glucoses to PCP next Tuesday and adjust insulin as needed 3. Please repeat BMP in 1 week to follow K 4. Complete antibiotics with Keflex for 3 more days     Home Health: PT/OT due to dementia  Equipment/Devices:   Discharge Condition: Fair  CODE STATUS: DO NOT RESUSCITATE Diet recommendation: Diabetic  Brief/Interim Summary: Mrs. Fouse is a 85 y.o. F with dementia, lives in memory care, falls, DM, HTN, and hypothyroidism who presented with 2 days generalized weakness, decreased responsiveness.  In the ER, afebrile, WBC normal.  Elevated BUN-creatinine ratio, glucose 440.  Urine with 11-20 WBC.  No urinary symptoms.  Started on antibiotics and admitted for UTI.            PRINCIPAL HOSPITAL DIAGNOSIS: Dehydration    Discharge Diagnoses:    Acute metabolic encephalopathy superimposed on dementia Patient presented with hyperglycemia, and decreased mentation.  Her encephalopathy/decreased mentation were likely due to hyperglycemia, dehydration.    The patient was treated with IV fluids and her mentation improved to baseline.   Diabetes Glucose 440 on admission without DKA.    Treated with subcutaneous insulin, and sugars improved.  However her sugars remained greater than 200, so she was discharged on new low-dose Lantus, with plans to titrate up or down by PCP in 1 week.  Hypertension Continue lisinopril, verapamil  Hypothyroidism Continue levothyroxine  Hypokalemia Supplemented              Discharge Instructions  Discharge Instructions    Diet - low sodium  heart healthy   Complete by: As directed    Discharge instructions   Complete by: As directed    Take cephalexin 500 mg three times daily for 3 more days   Administer new Lantus 6 units daily  Check blood sugar twice daily for 2 weeks Fax blood sugar log to primary care doctor next week on 5/18 to adjust or discontinue Lantus as needed   Increase activity slowly   Complete by: As directed      Allergies as of 04/04/2020      Reactions   Codeine Nausea And Vomiting      Medication List    TAKE these medications   acetaminophen 500 MG tablet Commonly known as: TYLENOL Take 500 mg by mouth 3 (three) times daily.   acetaminophen 325 MG tablet Commonly known as: TYLENOL Take 650 mg by mouth every 6 (six) hours as needed for fever.   aspirin EC 81 MG tablet Take 81 mg by mouth daily.   cephALEXin 500 MG capsule Commonly known as: KEFLEX Take 1 capsule (500 mg total) by mouth every 8 (eight) hours.   diphenhydrAMINE 25 MG tablet Commonly known as: BENADRYL Take 25 mg by mouth every 6 (six) hours as needed for itching or allergies.   insulin glargine 100 UNIT/ML injection Commonly known as: LANTUS Inject 0.06 mLs (6 Units total) into the skin daily.   levothyroxine 25 MCG tablet Commonly known as: SYNTHROID Take 25 mcg by mouth daily before breakfast.   lisinopril 5 MG tablet Commonly known as: ZESTRIL Take 5 mg by mouth daily.   memantine 10 MG  tablet Commonly known as: NAMENDA Take 10 mg by mouth 2 (two) times daily.   metFORMIN 1000 MG tablet Commonly known as: GLUCOPHAGE Take 1,000 mg by mouth 2 (two) times daily.   ondansetron 4 MG disintegrating tablet Commonly known as: Zofran ODT Take 1 tablet (4 mg total) by mouth every 8 (eight) hours as needed.   PARoxetine 10 MG tablet Commonly known as: PAXIL Take 10 mg by mouth daily.   pravastatin 20 MG tablet Commonly known as: PRAVACHOL Take 20 mg by mouth at bedtime.   verapamil 180 MG CR  tablet Commonly known as: CALAN-SR Take 180 mg by mouth daily.   Vitamin D 50 MCG (2000 UT) tablet Take 2,000 Units by mouth daily.       Allergies  Allergen Reactions  . Codeine Nausea And Vomiting    Consultations:     Procedures/Studies: CT Head Wo Contrast  Result Date: 04/02/2020 CLINICAL DATA:  Confusion/altered mental status EXAM: CT HEAD WITHOUT CONTRAST TECHNIQUE: Contiguous axial images were obtained from the base of the skull through the vertex without intravenous contrast. COMPARISON:  Mar 27, 2020 FINDINGS: Brain: Moderate diffuse atrophy is stable. Calcified meningioma at the left cerebellopontine angle apparently arising from the tentorium is a stable finding, measuring 2.1 x 1.7 cm. No other evident mass. No hemorrhage, extra-axial fluid collection, or midline shift. There is patchy small vessel disease in the centra semiovale bilaterally. No evident acute infarct. Vascular: No hyperdense vessel. There is calcification in each carotid siphon region. Skull: The bony calvarium appears intact. Sinuses/Orbits: There is opacification in the right maxillary antrum. There is mucosal thickening in multiple ethmoid air cells. Other visualized paranasal sinuses are clear. Visualized orbits appear symmetric bilaterally. Other: Mastoid air cells are clear. There is debris in each external auditory canal. IMPRESSION: 1. Stable atrophy with stable periventricular small vessel disease. No acute infarct evident. No hemorrhage. 2. Stable calcified meningioma near the left cerebellopontine angle arising from the tentorium. No associated edema in this area. No other mass. 3.  Foci of arterial vascular calcification noted. 4.  Foci of paranasal sinus disease. 5.  Probable cerumen in each external auditory canal. Electronically Signed   By: Lowella Grip III M.D.   On: 04/02/2020 10:22   CT Head Wo Contrast  Result Date: 03/27/2020 CLINICAL DATA:  Dementia, fall from wheelchair. EXAM: CT HEAD  WITHOUT CONTRAST TECHNIQUE: Contiguous axial images were obtained from the base of the skull through the vertex without intravenous contrast. COMPARISON:  January 07, 2020. FINDINGS: Brain: Mild diffuse cortical atrophy is noted. Mild chronic ischemic white matter disease is noted. Stable calcified meningioma is noted in the left cerebellopontine angle. No midline shift is noted. Ventricular size is within normal limits. No evidence of hemorrhage or acute infarction. Vascular: No hyperdense vessel or unexpected calcification. Skull: Normal. Negative for fracture or focal lesion. Sinuses/Orbits: No acute finding. Other: None. IMPRESSION: Mild diffuse cortical atrophy. Mild chronic ischemic white matter disease. Stable calcified meningioma in left cerebellopontine angle. No acute intracranial abnormality seen. Electronically Signed   By: Marijo Conception M.D.   On: 03/27/2020 12:38   DG Chest Port 1 View  Result Date: 04/02/2020 CLINICAL DATA:  Altered mental status EXAM: PORTABLE CHEST 1 VIEW COMPARISON:  January 10, 2016 FINDINGS: Lungs are clear. Heart size and pulmonary vascularity are normal. No adenopathy. There is aortic atherosclerosis. No bone lesions. IMPRESSION: Lungs clear. Cardiac silhouette within normal limits. Aortic Atherosclerosis (ICD10-I70.0). Electronically Signed   By: Lowella Grip  III M.D.   On: 04/02/2020 10:19   DG Hip Unilat With Pelvis 2-3 Views Left  Result Date: 03/27/2020 CLINICAL DATA:  Left hip pain. Large hematoma after fall. EXAM: DG HIP (WITH OR WITHOUT PELVIS) 2-3V LEFT COMPARISON:  None. FINDINGS: There is soft tissue swelling over the proximal left femur favored to represent a subcutaneous hematoma. There is no acute displaced fracture or dislocation. Degenerative changes are noted of both hips. Multiple calcifications project over the patient's pelvis and are favored to represent phleboliths. IMPRESSION: 1. No acute displaced fracture or dislocation. 2. Soft tissue  swelling over the proximal left femur favored to represent a subcutaneous hematoma. Electronically Signed   By: Constance Holster M.D.   On: 03/27/2020 15:52       Subjective: The patient has no complaints.  She was somewhat restless overnight, required telemetry sitter.  Today she is at baseline mentation.  No cough, diarrhea, respiratory distress, fever.  Discharge Exam: Vitals:   04/04/20 1052 04/04/20 1145  BP: 120/70 (!) 121/51  Pulse: 75 94  Resp: 16 12  Temp: 99 F (37.2 C) 98.6 F (37 C)  SpO2: 94% 95%   Vitals:   04/04/20 0042 04/04/20 1002 04/04/20 1052 04/04/20 1145  BP: (!) 130/54  120/70 (!) 121/51  Pulse: 79 88 75 94  Resp: 16 16 16 12   Temp: 98.3 F (36.8 C)  99 F (37.2 C) 98.6 F (37 C)  TempSrc: Oral  Oral Oral  SpO2: 94%  94% 95%  Weight:        General: Pt is awake, not in acute distress, sitting up comfortably in bed, responds to verbal cues Cardiovascular: RRR, nl S1-S2, no murmurs appreciated.   No LE edema.   Respiratory: Normal respiratory rate and rhythm.  CTAB without rales or wheezes. Abdominal: Abdomen soft and non-tender.  No distension or HSM.   Neuro/Psych: Strength symmetric in upper and lower extremities.  Judgment and insight appear really impaired.   The results of significant diagnostics from this hospitalization (including imaging, microbiology, ancillary and laboratory) are listed below for reference.     Microbiology: Recent Results (from the past 240 hour(s))  Urine culture     Status: Abnormal   Collection Time: 04/02/20 10:32 AM   Specimen: Urine, Random  Result Value Ref Range Status   Specimen Description   Final    URINE, RANDOM Performed at [facility], [address] [place], [place] [address]    Special Requests   Final    NONE Performed at [facility], [address] [place], [place] [address]    Culture (A)  Final    40,000 COLONIES/mL GROUP B STREP(S.AGALACTIAE)ISOLATED TESTING  AGAINST S. AGALACTIAE NOT ROUTINELY PERFORMED DUE TO PREDICTABILITY OF AMP/PEN/VAN SUSCEPTIBILITY. Performed at [facility], [place] [address] [place],  [address]    Report Status 04/03/2020 FINAL  Final  SARS Coronavirus 2 by RT PCR (hospital order, performed in [facility] hospital lab) Nasopharyngeal Nasopharyngeal Swab     Status: None   Collection Time: 04/02/20 11:22 AM   Specimen: Nasopharyngeal Swab  Result Value Ref Range Status   SARS Coronavirus 2 NEGATIVE NEGATIVE Final    Comment: (NOTE) SARS-CoV-2 target nucleic acids are NOT DETECTED. The SARS-CoV-2 RNA is generally detectable in upper and lower respiratory specimens during the acute phase of infection. The lowest concentration of SARS-CoV-2 viral copies this assay can detect is 250 copies / mL. A negative result does not preclude SARS-CoV-2 infection  and should not be used as the sole basis for treatment or other patient management decisions.  A negative result may occur with improper specimen collection / handling, submission of specimen other than nasopharyngeal swab, presence of viral mutation(s) within the areas targeted by this assay, and inadequate number of viral copies (<250 copies / mL). A negative result must be combined with clinical observations, patient history, and epidemiological information. Fact Sheet for Patients:   StrictlyIdeas.no Fact Sheet for Healthcare Providers: BankingDealers.co.za This test is not yet approved or cleared  by the [place] FDA and has been authorized for detection and/or diagnosis of SARS-CoV-2 by FDA under an Emergency Use Authorization (EUA).  This EUA will remain in effect (meaning this test can be used) for the duration of the COVID-19 declaration under Section 564(b)(1) of the Act, 21 U.S.C. section 360bbb-3(b)(1), unless the authorization is terminated or revoked sooner. Performed at [facility],  [address] [place], [place] [address]   MRSA PCR Screening     Status: None   Collection Time: 04/03/20 11:20 AM   Specimen: Nasopharyngeal  Result Value Ref Range Status   MRSA by PCR NEGATIVE NEGATIVE Final    Comment:        The GeneXpert MRSA Assay (FDA approved for NASAL specimens only), is one component of a comprehensive MRSA colonization surveillance program. It is not intended to diagnose MRSA infection nor to guide or monitor treatment for MRSA infections. Performed at [facility], [address] [place], [place] [address]      Labs: BNP (last 3 results) No results for input(s): BNP in the last 8760 hours. Basic Metabolic Panel: Recent Labs  Lab 04/02/20 0944 04/03/20 0524 04/04/20 0541  NA 144 146* 140  K 4.0 3.3* 3.2*  CL 104 112* 106  CO2 27 24 25   GLUCOSE 440* 244* 264*  BUN 24* 15 12  CREATININE 0.51 <0.30* 0.36*  CALCIUM 9.8 8.5* 8.4*   Liver Function Tests: No results for input(s): AST, ALT, ALKPHOS, BILITOT, PROT, ALBUMIN in the last 168 hours. No results for input(s): LIPASE, AMYLASE in the last 168 hours. No results for input(s): AMMONIA in the last 168 hours. CBC: Recent Labs  Lab 04/02/20 0944 04/03/20 0524 04/04/20 0541  WBC 10.9* 7.6 10.8*  NEUTROABS 7.9*  --   --   HGB 12.7 10.9* 11.4*  HCT 38.6 34.1* 33.8*  MCV 94.6 95.8 92.9  PLT 399 299 295   Cardiac Enzymes: No results for input(s): CKTOTAL, CKMB, CKMBINDEX, TROPONINI in the last 168 hours. BNP: Invalid input(s): POCBNP CBG: Recent Labs  Lab 04/02/20 2247 04/03/20 0831 04/03/20 1657 04/04/20 0812 04/04/20 1135  GLUCAP 227* 228* 150* 256* 165*   D-Dimer No results for input(s): DDIMER in the last 72 hours. Hgb A1c Recent Labs    04/02/20 1225  HGBA1C 9.8*   Lipid Profile No results for input(s): CHOL, HDL, LDLCALC, TRIG, CHOLHDL, LDLDIRECT in the last 72 hours. Thyroid function studies No results for input(s): TSH, T4TOTAL, T3FREE,  THYROIDAB in the last 72 hours.  Invalid input(s): FREET3 Anemia work up No results for input(s): VITAMINB12, FOLATE, FERRITIN, TIBC, IRON, RETICCTPCT in the last 72 hours. Urinalysis    Component Value Date/Time   COLORURINE YELLOW (A) 04/02/2020 1032   APPEARANCEUR CLEAR (A) 04/02/2020 1032   LABSPEC 1.028 04/02/2020 1032   PHURINE 5.0 04/02/2020 1032   GLUCOSEU >=500 (A) 04/02/2020 1032   HGBUR NEGATIVE 04/02/2020 [place] 04/02/2020 1032  KETONESUR 80 (A) 04/02/2020 1032   PROTEINUR NEGATIVE 04/02/2020 1032   NITRITE NEGATIVE 04/02/2020 1032   LEUKOCYTESUR SMALL (A) 04/02/2020 1032   Sepsis Labs Invalid input(s): PROCALCITONIN,  WBC,  LACTICIDVEN Microbiology Recent Results (from the past 240 hour(s))  Urine culture     Status: Abnormal   Collection Time: 04/02/20 10:32 AM   Specimen: Urine, Random  Result Value Ref Range Status   Specimen Description   Final    URINE, RANDOM Performed at [facility], [address], [place], [place] [address]    Special Requests   Final    NONE Performed at [facility], [address] [place], [place] [address]    Culture (A)  Final    40,000 COLONIES/mL GROUP B STREP(S.AGALACTIAE)ISOLATED TESTING AGAINST S. AGALACTIAE NOT ROUTINELY PERFORMED DUE TO PREDICTABILITY OF AMP/PEN/VAN SUSCEPTIBILITY. Performed at [facility], [place] [address] [place], [place] [address]    Report Status 04/03/2020 FINAL  Final  SARS Coronavirus 2 by RT PCR (hospital order, performed in [facility] hospital lab) Nasopharyngeal Nasopharyngeal Swab     Status: None   Collection Time: 04/02/20 11:22 AM   Specimen: Nasopharyngeal Swab  Result Value Ref Range Status   SARS Coronavirus 2 NEGATIVE NEGATIVE Final    Comment: (NOTE) SARS-CoV-2 target nucleic acids are NOT DETECTED. The SARS-CoV-2 RNA is generally detectable in upper and lower respiratory specimens during the acute phase of infection. The  lowest concentration of SARS-CoV-2 viral copies this assay can detect is 250 copies / mL. A negative result does not preclude SARS-CoV-2 infection and should not be used as the sole basis for treatment or other patient management decisions.  A negative result may occur with improper specimen collection / handling, submission of specimen other than nasopharyngeal swab, presence of viral mutation(s) within the areas targeted by this assay, and inadequate number of viral copies (<250 copies / mL). A negative result must be combined with clinical observations, patient history, and epidemiological information. Fact Sheet for Patients:   StrictlyIdeas.no Fact Sheet for Healthcare Providers: BankingDealers.co.za This test is not yet approved or cleared  by the [place] FDA and has been authorized for detection and/or diagnosis of SARS-CoV-2 by FDA under an Emergency Use Authorization (EUA).  This EUA will remain in effect (meaning this test can be used) for the duration of the COVID-19 declaration under Section 564(b)(1) of the Act, 21 U.S.C. section 360bbb-3(b)(1), unless the authorization is terminated or revoked sooner. Performed at [facility], [address] [place], [place] [address]   MRSA PCR Screening     Status: None   Collection Time: 04/03/20 11:20 AM   Specimen: Nasopharyngeal  Result Value Ref Range Status   MRSA by PCR NEGATIVE NEGATIVE Final    Comment:        The GeneXpert MRSA Assay (FDA approved for NASAL specimens only), is one component of a comprehensive MRSA colonization surveillance program. It is not intended to diagnose MRSA infection nor to guide or monitor treatment for MRSA infections. Performed at [facility], [address] [place], [place] [address]      Time coordinating discharge: 35 minutes      SIGNED:   Edwin Dada, MD  [facility] 04/04/2020,  12:47 PM

## 2020-04-04 NOTE — TOC Transition Note (Signed)
Transition of Care [facility]) - CM/SW Discharge Note   Patient Details  Name: Miranda Clay MRN: [id number] Date of Birth: 1935-09-04  [facility] CM/SW Contact:  Boris Sharper, LCSW Phone Number: 04/04/2020, 12:49 PM   Clinical Narrative:    Pt medically stable for discharge per MD. Pt's daughter aware of discharge toady. CSW contacted [facility] to notify of discharge. Call to report number is [phone] ask for [facility]. Pt will be transported via EMS, CSW will arrange.   Final next level of care: Memory Care Barriers to Discharge: No Barriers Identified   Patient Goals and CMS Choice Patient states their goals for this hospitalization and ongoing recovery are:: Daughter is good with her staybign tonight and getting fluids and going back tomorrow      Discharge Placement                Patient to be transferred to facility by: EMS Name of family member notified: [place] Patient and family notified of of transfer: 04/04/20  Discharge Plan and Services In-house Referral: Clinical Social Work                                   Social Determinants of Health (SDOH) Interventions     Readmission Risk Interventions No flowsheet data found.

## 2020-04-04 NOTE — Progress Notes (Signed)
Tele sitter has been set up at this point. To aid in patient safety.

## 2020-04-05 LAB — GLUCOSE, CAPILLARY: Glucose-Capillary: 169 mg/dL — ABNORMAL HIGH (ref 70–99)

## 2020-04-06 LAB — GLUCOSE, CAPILLARY
Glucose-Capillary: 235 mg/dL — ABNORMAL HIGH (ref 70–99)
Glucose-Capillary: 199 mg/dL — ABNORMAL HIGH (ref 70–99)

## 2020-04-16 LAB — BLOOD GAS, VENOUS
Acid-Base Excess: 1.4 mmol/L (ref 0.0–2.0)
Bicarbonate: 28.1 mmol/L — ABNORMAL HIGH (ref 20.0–28.0)
O2 Saturation: 48 %
Patient temperature: 37
pCO2, Ven: 52 mmHg (ref 44.0–60.0)
pH, Ven: 7.34 (ref 7.250–7.430)

## 2020-05-05 ENCOUNTER — Encounter: Payer: Self-pay | Admitting: Emergency Medicine

## 2020-05-05 ENCOUNTER — Emergency Department
Admission: EM | Admit: 2020-05-05 | Discharge: 2020-05-05 | Disposition: A | Discharge status: Home / Self Care | Payer: Medicare Other | Attending: Student in an Organized Health Care Education/Training Program | Admitting: Student in an Organized Health Care Education/Training Program

## 2020-05-05 ENCOUNTER — Emergency Department: Payer: Medicare Other

## 2020-05-05 ENCOUNTER — Other Ambulatory Visit: Payer: Self-pay

## 2020-05-05 DIAGNOSIS — Z79899 Other long term (current) drug therapy: Secondary | ICD-10-CM | POA: Diagnosis not present

## 2020-05-05 DIAGNOSIS — E039 Hypothyroidism, unspecified: Secondary | ICD-10-CM | POA: Diagnosis not present

## 2020-05-05 DIAGNOSIS — Z7982 Long term (current) use of aspirin: Secondary | ICD-10-CM | POA: Diagnosis not present

## 2020-05-05 DIAGNOSIS — E119 Type 2 diabetes mellitus without complications: Secondary | ICD-10-CM | POA: Diagnosis not present

## 2020-05-05 DIAGNOSIS — Z87891 Personal history of nicotine dependence: Secondary | ICD-10-CM | POA: Diagnosis not present

## 2020-05-05 DIAGNOSIS — W050XXA Fall from non-moving wheelchair, initial encounter: Secondary | ICD-10-CM | POA: Insufficient documentation

## 2020-05-05 DIAGNOSIS — Y939 Activity, unspecified: Secondary | ICD-10-CM | POA: Diagnosis not present

## 2020-05-05 DIAGNOSIS — Z794 Long term (current) use of insulin: Secondary | ICD-10-CM | POA: Insufficient documentation

## 2020-05-05 DIAGNOSIS — I10 Essential (primary) hypertension: Secondary | ICD-10-CM | POA: Insufficient documentation

## 2020-05-05 DIAGNOSIS — F0390 Unspecified dementia without behavioral disturbance: Secondary | ICD-10-CM | POA: Diagnosis not present

## 2020-05-05 DIAGNOSIS — Y999 Unspecified external cause status: Secondary | ICD-10-CM | POA: Diagnosis not present

## 2020-05-05 DIAGNOSIS — W19XXXA Unspecified fall, initial encounter: Secondary | ICD-10-CM

## 2020-05-05 DIAGNOSIS — Y92129 Unspecified place in nursing home as the place of occurrence of the external cause: Secondary | ICD-10-CM | POA: Diagnosis not present

## 2020-05-05 DIAGNOSIS — R519 Headache, unspecified: Secondary | ICD-10-CM | POA: Insufficient documentation

## 2020-05-05 NOTE — ED Triage Notes (Signed)
Presents via EMS s/p fall  Per EMS she had a witnessed all  Hit her head   No LOC

## 2020-05-05 NOTE — Discharge Instructions (Addendum)
There are no acute abnormalities on your head or neck CT.  Please follow-up with primary care.

## 2020-05-05 NOTE — ED Notes (Signed)
FSBS 145

## 2020-05-05 NOTE — ED Provider Notes (Signed)
[facility] Emergency Department Provider Note  ____________________________________________  Time seen: Approximately 4:08 PM  I have reviewed the triage vital signs and the nursing notes.   HISTORY  Chief Complaint Fall    HPI Miranda Clay is a 85 y.o. female that presents to the emergency department for evaluation of fall.  Patient is a resident medicine memory care.  Per [facility], patient was trying to get up out of her wheelchair when she "slid" to the floor hitting the back of her head.  No loss of consciousness.  Patient denies any pain.  Patient has dementia and is unable to provide any additional history.  Daughter, who is here with the patient states that she is at baseline.  Daughter states the patient has been here several times previously for falls.  Daughter states that memory care center has been unable to find her alarm for the wheelchair to notify when patient is getting up.  Past Medical History:  Diagnosis Date  . Dementia ([place])   . Diabetes mellitus without complication ([place])   . Hyperlipidemia   . Hypertension   . Hypothyroidism     Patient Active Problem List   Diagnosis Date Noted  . Protein-calorie malnutrition, severe 04/03/2020  . Acute lower UTI 04/02/2020  . UTI (urinary tract infection) 04/02/2020  . Dementia ([place])   . Diabetes mellitus with hyperglycemia ([place])   . Hypothyroidism   . Hypertension   . Frequent falls     History reviewed. No pertinent surgical history.  Prior to Admission medications   Medication Sig Start Date End Date Taking? Authorizing Provider  acetaminophen (TYLENOL) 325 MG tablet Take 650 mg by mouth every 6 (six) hours as needed for fever.    [provider]  acetaminophen (TYLENOL) 500 MG tablet Take 500 mg by mouth 3 (three) times daily.    [provider]  aspirin EC 81 MG tablet Take 81 mg by mouth daily.    [provider]  Cholecalciferol (VITAMIN D) 2000  UNITS tablet Take 2,000 Units by mouth daily.    [provider]  diphenhydrAMINE (BENADRYL) 25 MG tablet Take 25 mg by mouth every 6 (six) hours as needed for itching or allergies.    [provider]  insulin glargine (LANTUS) 100 UNIT/ML injection Inject 0.06 mLs (6 Units total) into the skin daily. 04/04/20   Danford, Suann Larry, MD  levothyroxine (SYNTHROID, LEVOTHROID) 25 MCG tablet Take 25 mcg by mouth daily before breakfast.    [provider]  lisinopril (ZESTRIL) 5 MG tablet Take 5 mg by mouth daily.    [provider]  memantine (NAMENDA) 10 MG tablet Take 10 mg by mouth 2 (two) times daily.    [provider]  metFORMIN (GLUCOPHAGE) 1000 MG tablet Take 1,000 mg by mouth 2 (two) times daily.     [provider]  ondansetron (ZOFRAN ODT) 4 MG disintegrating tablet Take 1 tablet (4 mg total) by mouth every 8 (eight) hours as needed. 02/11/20   Lavonia Drafts, MD  PARoxetine (PAXIL) 10 MG tablet Take 10 mg by mouth daily.    [provider]  pravastatin (PRAVACHOL) 20 MG tablet Take 20 mg by mouth at bedtime.     [provider]  verapamil (CALAN-SR) 180 MG CR tablet Take 180 mg by mouth daily.    [provider]    Allergies Codeine  No family history on file.  Social History Social History   Tobacco Use  .  Smoking status: Former Research scientist (life sciences)  . Smokeless tobacco: Never Used  Vaping Use  . Vaping Use: Never used  Substance Use Topics  . Alcohol use: No  . Drug use: No     Review of Systems  Cardiovascular: No chest pain. Respiratory: No SOB. Gastrointestinal: No abdominal pain.  No nausea, no vomiting.  Musculoskeletal: Negative for musculoskeletal pain. Skin: Negative for rash, abrasions, lacerations, ecchymosis. Neurological: Negative for headaches, numbness or tingling   ____________________________________________   PHYSICAL EXAM:  VITAL SIGNS: ED Triage Vitals  Enc Vitals Group      BP 05/05/20 1554 (!) 152/81     Pulse Rate 05/05/20 1554 61     Resp 05/05/20 1554 20     Temp 05/05/20 1554 98 F (36.7 C)     Temp Source 05/05/20 1554 Oral     SpO2 05/05/20 1554 97 %     Weight 05/05/20 1555 119 lb 0.8 oz (54 kg)     Height 05/05/20 1555 5\' 2"  (1.575 m)     Head Circumference --      Peak Flow --      Pain Score 05/05/20 1555 0     Pain Loc --      Pain Edu? --      Excl. in [place]? --      Constitutional: Alert and oriented. Well appearing and in no acute distress. Eyes: Conjunctivae are normal. PERRL. EOMI. Head: Small hematoma to posterior head. ENT:      Ears:      Nose: No congestion/rhinnorhea.      Mouth/Throat: Mucous membranes are moist.  Neck: No stridor.  No cervical spine tenderness to palpation. Cardiovascular: Normal rate, regular rhythm.  Good peripheral circulation. Respiratory: Normal respiratory effort without tachypnea or retractions. Lungs CTAB. Good air entry to the bases with no decreased or absent breath sounds. Gastrointestinal: Bowel sounds 4 quadrants. Soft and nontender to palpation. No guarding or rigidity. No palpable masses. No distention.  Musculoskeletal: Full range of motion to all extremities. No gross deformities appreciated. Neurologic:  Normal speech and language. No gross focal neurologic deficits are appreciated.  Skin:  Skin is warm, dry and intact. No rash noted. Psychiatric: Mood and affect are normal. Speech and behavior are normal. Patient exhibits appropriate insight and judgement.   ____________________________________________   LABS (all labs ordered are listed, but only abnormal results are displayed)  Labs Reviewed - No data to display ____________________________________________  EKG   ____________________________________________  RADIOLOGY   CT Head Wo Contrast  Result Date: 05/05/2020 CLINICAL DATA:  Head trauma, witnessed fall EXAM: CT HEAD WITHOUT CONTRAST CT CERVICAL SPINE WITHOUT CONTRAST  TECHNIQUE: Multidetector CT imaging of the head and cervical spine was performed following the standard protocol without intravenous contrast. Multiplanar CT image reconstructions of the cervical spine were also generated. COMPARISON:  CT head 04/02/2020, CT head and cervical spine 01/07/2020 FINDINGS: CT HEAD FINDINGS Brain: Region of remote encephalomalacia in the left anterior temporal pole is unchanged from comparison CT. Stable appearance of a calcified meningioma at the left cerebellopontine angle measuring approximately 2.1 by 1.7 cm in maximal dimensions, unchanged from prior exam with minimal adjacent mass effect upon the left cerebellar hemisphere. No associated vasogenic edema. No evidence of acute infarction, hemorrhage, hydrocephalus, extra-axial collection, new mass lesion or mass effect. Symmetric prominence of the ventricles, cisterns and sulci compatible with parenchymal volume loss. Patchy areas of white matter hypoattenuation are most compatible with chronic microvascular angiopathy. Vascular: Atherosclerotic calcification of the carotid  siphons. No hyperdense vessel. Skull: There is soft tissue thickening and mild swelling with a crescentic hematoma measuring up to 3 mm in maximal thickness over the left parieto-occipital scalp (axial 53). No subjacent calvarial fracture. Minimal hyperostotic changes of the left petrous apex and otic capsule adjacent the meningioma detailed above. Sinuses/Orbits: Minimal thickening in the right maxillary sinus, largely beyond the level of imaging. Remaining paranasal sinuses and mastoid air cells are predominantly clear. Included orbital structures are unremarkable. Other: None CT CERVICAL SPINE FINDINGS Alignment: Stabilization collar absent at the time of examination. Slight cervical flexion on scout view. Mild degenerative anterolisthesis C2 on 3 and retrolisthesis C3 on 4 is unchanged from comparison. No evidence of traumatic listhesis. No abnormally widened,  perched or jumped facets. Normal alignment of the craniocervical and atlantoaxial articulations. Skull base and vertebrae: No acute skull base fracture, vertebral body fracture, vertebral body height loss or suspicious osseous lesions. Moderate arthrosis at the C1-2 articulation as well as a degenerative spur/osteophyte at the base and dens interval as well. Appearance is unchanged from comparison. Additional multilevel spondylitic changes detailed below. Soft tissues and spinal canal: No pre or paravertebral fluid or swelling. No visible canal hematoma. Disc levels: Multilevel intervertebral disc height loss with spondylitic endplate changes. Multilevel disc osteophyte complexes result in at most mild canal stenosis most pronounced C5-6. Multilevel uncinate spurring and facet hypertrophic changes result in mild-to-moderate multilevel neural foraminal narrowing more moderate severe bilateral foraminal narrowing C5-6 as well. Upper chest: No acute abnormality in the upper chest or imaged lung apices. Other: No concerning thyroid nodules. Calcification in the proximal great vessels and cervical carotids. IMPRESSION: 1. Mild contusive changes with a crescentic hematoma over the left parieto-occipital scalp. No subjacent calvarial fracture. 2. No evidence of acute intracranial abnormality. 3. Chronic encephalomalacia in the left anterior temporal pole, can be sequela prior parenchymal contusion or less likely infarct. 4. Unchanged appearance of a calcified meningioma at the left cerebellopontine angle with minimal hyperostotic features of the left petrous apex and otic capsule. 5. Stable parenchymal volume loss and chronic microvascular angiopathy. 6. No acute fracture or traumatic listhesis of the cervical spine. 7. Multilevel spondylitic changes of the cervical spine as described. Features maximal C5-6. 8. Intracranial and cervical atherosclerosis. Electronically Signed   By: Lovena Le M.D.   On: 05/05/2020 16:50    CT Cervical Spine Wo Contrast  Result Date: 05/05/2020 CLINICAL DATA:  Head trauma, witnessed fall EXAM: CT HEAD WITHOUT CONTRAST CT CERVICAL SPINE WITHOUT CONTRAST TECHNIQUE: Multidetector CT imaging of the head and cervical spine was performed following the standard protocol without intravenous contrast. Multiplanar CT image reconstructions of the cervical spine were also generated. COMPARISON:  CT head 04/02/2020, CT head and cervical spine 01/07/2020 FINDINGS: CT HEAD FINDINGS Brain: Region of remote encephalomalacia in the left anterior temporal pole is unchanged from comparison CT. Stable appearance of a calcified meningioma at the left cerebellopontine angle measuring approximately 2.1 by 1.7 cm in maximal dimensions, unchanged from prior exam with minimal adjacent mass effect upon the left cerebellar hemisphere. No associated vasogenic edema. No evidence of acute infarction, hemorrhage, hydrocephalus, extra-axial collection, new mass lesion or mass effect. Symmetric prominence of the ventricles, cisterns and sulci compatible with parenchymal volume loss. Patchy areas of white matter hypoattenuation are most compatible with chronic microvascular angiopathy. Vascular: Atherosclerotic calcification of the carotid siphons. No hyperdense vessel. Skull: There is soft tissue thickening and mild swelling with a crescentic hematoma measuring up to 3 mm in maximal thickness  over the left parieto-occipital scalp (axial 53). No subjacent calvarial fracture. Minimal hyperostotic changes of the left petrous apex and otic capsule adjacent the meningioma detailed above. Sinuses/Orbits: Minimal thickening in the right maxillary sinus, largely beyond the level of imaging. Remaining paranasal sinuses and mastoid air cells are predominantly clear. Included orbital structures are unremarkable. Other: None CT CERVICAL SPINE FINDINGS Alignment: Stabilization collar absent at the time of examination. Slight cervical flexion  on scout view. Mild degenerative anterolisthesis C2 on 3 and retrolisthesis C3 on 4 is unchanged from comparison. No evidence of traumatic listhesis. No abnormally widened, perched or jumped facets. Normal alignment of the craniocervical and atlantoaxial articulations. Skull base and vertebrae: No acute skull base fracture, vertebral body fracture, vertebral body height loss or suspicious osseous lesions. Moderate arthrosis at the C1-2 articulation as well as a degenerative spur/osteophyte at the base and dens interval as well. Appearance is unchanged from comparison. Additional multilevel spondylitic changes detailed below. Soft tissues and spinal canal: No pre or paravertebral fluid or swelling. No visible canal hematoma. Disc levels: Multilevel intervertebral disc height loss with spondylitic endplate changes. Multilevel disc osteophyte complexes result in at most mild canal stenosis most pronounced C5-6. Multilevel uncinate spurring and facet hypertrophic changes result in mild-to-moderate multilevel neural foraminal narrowing more moderate severe bilateral foraminal narrowing C5-6 as well. Upper chest: No acute abnormality in the upper chest or imaged lung apices. Other: No concerning thyroid nodules. Calcification in the proximal great vessels and cervical carotids. IMPRESSION: 1. Mild contusive changes with a crescentic hematoma over the left parieto-occipital scalp. No subjacent calvarial fracture. 2. No evidence of acute intracranial abnormality. 3. Chronic encephalomalacia in the left anterior temporal pole, can be sequela prior parenchymal contusion or less likely infarct. 4. Unchanged appearance of a calcified meningioma at the left cerebellopontine angle with minimal hyperostotic features of the left petrous apex and otic capsule. 5. Stable parenchymal volume loss and chronic microvascular angiopathy. 6. No acute fracture or traumatic listhesis of the cervical spine. 7. Multilevel spondylitic changes of  the cervical spine as described. Features maximal C5-6. 8. Intracranial and cervical atherosclerosis. Electronically Signed   By: Lovena Le M.D.   On: 05/05/2020 16:50    ____________________________________________    PROCEDURES  Procedure(s) performed:    Procedures    Medications - No data to display   ____________________________________________   INITIAL IMPRESSION / ASSESSMENT AND PLAN / ED COURSE  Pertinent labs & imaging results that were available during my care of the patient were reviewed by me and considered in my medical decision making (see chart for details).  Review of the [place] CSRS was performed in accordance of the [place] prior to dispensing any controlled drugs.   Patient presented to emergency department for evaluation of fall.  Vital signs and exam are reassuring.  CT head and cervical spine are negative for acute intracranial or bony abnormalities.  Patient is here with daughter, who is agreeable with plan of care.  Daughter will transport patient back to [facility] memory care.  Patient is to follow up with primary care as directed. Patient is given ED precautions to return to the ED for any worsening or new symptoms.  Miranda Clay was evaluated in Emergency Department on 05/05/2020 for the symptoms described in the history of present illness. She was evaluated in the context of the global COVID-19 pandemic, which necessitated consideration that the patient might be at risk for infection with the SARS-CoV-2 virus that causes COVID-19. Institutional protocols and algorithms that  pertain to the evaluation of patients at risk for COVID-19 are in a state of rapid change based on information released by regulatory bodies including the CDC and federal and state organizations. These policies and algorithms were followed during the patient's care in the ED.   ____________________________________________  FINAL CLINICAL IMPRESSION(S) / ED DIAGNOSES  Final diagnoses:  Fall,  initial encounter      NEW MEDICATIONS STARTED DURING THIS VISIT:  ED Discharge Orders    None          This chart was dictated using voice recognition software/Dragon. Despite best efforts to proofread, errors can occur which can change the meaning. Any change was purely unintentional.    Laban Emperor, PA-C 05/05/20 2203    Merlyn Lot, MD 05/05/20 2243

## 2020-05-08 ENCOUNTER — Emergency Department: Payer: Medicare Other

## 2020-05-08 ENCOUNTER — Emergency Department
Admission: EM | Admit: 2020-05-08 | Discharge: 2020-05-08 | Disposition: A | Discharge status: Home / Self Care | Payer: Medicare Other | Attending: Emergency Medicine | Admitting: Emergency Medicine

## 2020-05-08 ENCOUNTER — Other Ambulatory Visit: Payer: Self-pay

## 2020-05-08 ENCOUNTER — Encounter: Payer: Self-pay | Admitting: Emergency Medicine

## 2020-05-08 DIAGNOSIS — E039 Hypothyroidism, unspecified: Secondary | ICD-10-CM | POA: Diagnosis not present

## 2020-05-08 DIAGNOSIS — I10 Essential (primary) hypertension: Secondary | ICD-10-CM | POA: Diagnosis not present

## 2020-05-08 DIAGNOSIS — S81812A Laceration without foreign body, left lower leg, initial encounter: Secondary | ICD-10-CM | POA: Insufficient documentation

## 2020-05-08 DIAGNOSIS — Y939 Activity, unspecified: Secondary | ICD-10-CM | POA: Insufficient documentation

## 2020-05-08 DIAGNOSIS — Y999 Unspecified external cause status: Secondary | ICD-10-CM | POA: Insufficient documentation

## 2020-05-08 DIAGNOSIS — F0390 Unspecified dementia without behavioral disturbance: Secondary | ICD-10-CM | POA: Insufficient documentation

## 2020-05-08 DIAGNOSIS — Z79899 Other long term (current) drug therapy: Secondary | ICD-10-CM | POA: Diagnosis not present

## 2020-05-08 DIAGNOSIS — Y92129 Unspecified place in nursing home as the place of occurrence of the external cause: Secondary | ICD-10-CM | POA: Insufficient documentation

## 2020-05-08 DIAGNOSIS — Z7982 Long term (current) use of aspirin: Secondary | ICD-10-CM | POA: Diagnosis not present

## 2020-05-08 DIAGNOSIS — W010XXA Fall on same level from slipping, tripping and stumbling without subsequent striking against object, initial encounter: Secondary | ICD-10-CM | POA: Diagnosis not present

## 2020-05-08 DIAGNOSIS — E119 Type 2 diabetes mellitus without complications: Secondary | ICD-10-CM | POA: Diagnosis not present

## 2020-05-08 DIAGNOSIS — Z87891 Personal history of nicotine dependence: Secondary | ICD-10-CM | POA: Diagnosis not present

## 2020-05-08 DIAGNOSIS — S0990XA Unspecified injury of head, initial encounter: Secondary | ICD-10-CM | POA: Diagnosis present

## 2020-05-08 DIAGNOSIS — Z794 Long term (current) use of insulin: Secondary | ICD-10-CM | POA: Diagnosis not present

## 2020-05-08 MED ORDER — LORAZEPAM 2 MG/ML IJ SOLN
0.5000 mg | Freq: Once | INTRAMUSCULAR | Status: AC
  Administered 2020-05-08: 0.5 mg via INTRAMUSCULAR
  Filled 2020-05-08: qty 1

## 2020-05-08 MED ORDER — ONDANSETRON 4 MG PO TBDP
4.0000 mg | ORAL_TABLET | Freq: Once | ORAL | Status: AC
  Administered 2020-05-08: 4 mg via ORAL
  Filled 2020-05-08: qty 1

## 2020-05-08 MED ORDER — CEPHALEXIN 500 MG PO CAPS: 500.0000 mg | ORAL_CAPSULE | Freq: Two times a day (BID) | ORAL | 0 refills | Status: AC

## 2020-05-08 MED ORDER — TRAMADOL HCL 50 MG PO TABS
50.0000 mg | ORAL_TABLET | Freq: Once | ORAL | Status: AC
  Administered 2020-05-08: 50 mg via ORAL
  Filled 2020-05-08: qty 1

## 2020-05-08 NOTE — ED Triage Notes (Deleted)
Pt to ED via [facility] from home for a fall. Pt states that she was mis stepped on the porch and fell. Pt states that both or her knee are hurting, he right shoulder and her left ribs. Pt is A & O, in NAD.

## 2020-05-08 NOTE — ED Notes (Signed)
Pt visualized lying in bed, daughter bedside. Daughter asked about pt's pain and agitation, this RN gave pt medication as ordered by EDP. NAD noted.

## 2020-05-08 NOTE — ED Triage Notes (Signed)
Pt to ED via [facility] from [facility]. Per staff pt hit her head when she fell. Pt has a laceration on her left leg. Per daughter, pt has fallen several times recently. Pt has hx/o dementia.

## 2020-05-08 NOTE — Discharge Instructions (Signed)
Take Keflex twice daily for the next 7 days. Please change dressing once daily.  Please minimize the amount of tape used to avoid skin tears. A bulky gauze dressing will work sufficiently wrapped minimally in order to prevent the patient from being disturbed. Wound can be gently cleansed with saline.

## 2020-05-08 NOTE — ED Provider Notes (Signed)
Emergency Department Provider Note  ____________________________________________  Time seen: Approximately 10:00 PM  I have reviewed the triage vital signs and the nursing notes.   HISTORY  Chief Complaint Fall   Historian Patient     HPI Miranda Clay is a 85 y.o. female presents to the emergency department with a 6 cm, triangular in shape skin tear along the anterior aspect of the left shin.  Patient has had multiple falls in the past few months and is in the process of changing skilled nursing facilities.  Patient is currently in memory care.  She cannot provide historical information.   Past Medical History:  Diagnosis Date  . Dementia ([place])   . Diabetes mellitus without complication ([place])   . Hyperlipidemia   . Hypertension   . Hypothyroidism      Immunizations up to date:  Yes.     Past Medical History:  Diagnosis Date  . Dementia ([place])   . Diabetes mellitus without complication ([place])   . Hyperlipidemia   . Hypertension   . Hypothyroidism     Patient Active Problem List   Diagnosis Date Noted  . Protein-calorie malnutrition, severe 04/03/2020  . Acute lower UTI 04/02/2020  . UTI (urinary tract infection) 04/02/2020  . Dementia ([place])   . Diabetes mellitus with hyperglycemia ([place])   . Hypothyroidism   . Hypertension   . Frequent falls     History reviewed. No pertinent surgical history.  Prior to Admission medications   Medication Sig Start Date End Date Taking? Authorizing Provider  acetaminophen (TYLENOL) 325 MG tablet Take 650 mg by mouth every 6 (six) hours as needed for fever.    [provider]  acetaminophen (TYLENOL) 500 MG tablet Take 500 mg by mouth 3 (three) times daily.    [provider]  aspirin EC 81 MG tablet Take 81 mg by mouth daily.    [provider]  cephALEXin (KEFLEX) 500 MG capsule Take 1 capsule (500 mg total) by mouth 2 (two) times daily for 7 days. 05/08/20 05/15/20  Lannie Fields, PA-C   Cholecalciferol (VITAMIN D) 2000 UNITS tablet Take 2,000 Units by mouth daily.    [provider]  diphenhydrAMINE (BENADRYL) 25 MG tablet Take 25 mg by mouth every 6 (six) hours as needed for itching or allergies.    [provider]  insulin glargine (LANTUS) 100 UNIT/ML injection Inject 0.06 mLs (6 Units total) into the skin daily. 04/04/20   Danford, Suann Larry, MD  levothyroxine (SYNTHROID, LEVOTHROID) 25 MCG tablet Take 25 mcg by mouth daily before breakfast.    [provider]  lisinopril (ZESTRIL) 5 MG tablet Take 5 mg by mouth daily.    [provider]  memantine (NAMENDA) 10 MG tablet Take 10 mg by mouth 2 (two) times daily.    [provider]  metFORMIN (GLUCOPHAGE) 1000 MG tablet Take 1,000 mg by mouth 2 (two) times daily.     [provider]  ondansetron (ZOFRAN ODT) 4 MG disintegrating tablet Take 1 tablet (4 mg total) by mouth every 8 (eight) hours as needed. 02/11/20   Lavonia Drafts, MD  PARoxetine (PAXIL) 10 MG tablet Take 10 mg by mouth daily.    [provider]  pravastatin (PRAVACHOL) 20 MG tablet Take 20 mg by mouth at bedtime.     [provider]  verapamil (CALAN-SR) 180 MG CR tablet Take 180 mg by mouth daily.    [provider]    Allergies Codeine  No  family history on file.  Social History Social History   Tobacco Use  . Smoking status: Former Research scientist (life sciences)  . Smokeless tobacco: Never Used  Vaping Use  . Vaping Use: Never used  Substance Use Topics  . Alcohol use: No  . Drug use: No     Review of Systems  Constitutional: No fever/chills Eyes:  No discharge ENT: No upper respiratory complaints. Respiratory: no cough. No SOB/ use of accessory muscles to breath Gastrointestinal:   No nausea, no vomiting.  No diarrhea.  No constipation. Musculoskeletal: Negative for musculoskeletal pain. Skin: Patient has skin  tear.    ____________________________________________   PHYSICAL EXAM:  VITAL SIGNS: ED Triage Vitals  Enc Vitals Group     BP 05/08/20 1508 (!) 165/90     Pulse Rate 05/08/20 1508 70     Resp 05/08/20 1508 16     Temp 05/08/20 1508 98.7 F (37.1 C)     Temp Source 05/08/20 1508 Oral     SpO2 05/08/20 1508 96 %     Weight --      Height --      Head Circumference --      Peak Flow --      Pain Score 05/08/20 2152 Asleep     Pain Loc --      Pain Edu? --      Excl. in [place]? --      Constitutional: Alert and oriented. Well appearing and in no acute distress. Eyes: Conjunctivae are normal. PERRL. EOMI. Head: Atraumatic. ENT:      Nose: No congestion/rhinnorhea.      Mouth/Throat: Mucous membranes are moist.  Neck: No stridor.  No cervical spine tenderness to palpation. Cardiovascular: Normal rate, regular rhythm. Normal S1 and S2.  Good peripheral circulation. Respiratory: Normal respiratory effort without tachypnea or retractions. Lungs CTAB. Good air entry to the bases with no decreased or absent breath sounds Gastrointestinal: Bowel sounds x 4 quadrants. Soft and nontender to palpation. No guarding or rigidity. No distention. Musculoskeletal: Full range of motion to all extremities. No obvious deformities noted Neurologic:  Normal for age. No gross focal neurologic deficits are appreciated.  Skin: Patient has a 6 cm skin tear along the anterior aspect of the left shin.  Flap is intact. Psychiatric: Mood and affect are normal for age. Speech and behavior are normal.   ____________________________________________   LABS (all labs ordered are listed, but only abnormal results are displayed)  Labs Reviewed - No data to display ____________________________________________  EKG   ____________________________________________  RADIOLOGY Unk Pinto, personally viewed and evaluated these images (plain radiographs) as part of my medical decision making, as well as  reviewing the written report by the radiologist.  CT Head Wo Contrast  Result Date: 05/08/2020 CLINICAL DATA:  Head trauma secondary to a fall.  Dementia. EXAM: CT HEAD WITHOUT CONTRAST TECHNIQUE: Contiguous axial images were obtained from the base of the skull through the vertex without intravenous contrast. COMPARISON:  None. FINDINGS: Brain: No evidence of acute infarction, hemorrhage or extra-axial fluid collection. Diffuse cerebral cortical atrophy, most severe in the frontal and temporal lobes, stable. Densely calcified 2 cm meningioma in the left cerebellopontine angle, stable. Slight asymmetry of the lateral ventricles, chronic. Vascular: No hyperdense vessel or unexpected calcification. Skull: Normal. Negative for fracture or focal lesion. Sinuses/Orbits: No acute finding. Other: None IMPRESSION: 1. No acute abnormality. Diffuse cerebral cortical atrophy, most severe in the frontal and temporal lobes. 2. Stable densely calcified meningioma in  the left cerebellopontine angle. Electronically Signed   By: Lorriane Shire M.D.   On: 05/08/2020 16:08    ____________________________________________    PROCEDURES  Procedure(s) performed:     Marland KitchenMarland KitchenLaceration Repair  Date/Time: 05/08/2020 10:03 PM Performed by: Lannie Fields, PA-C Authorized by: Lannie Fields, PA-C   Consent:    Consent obtained:  Verbal   Consent given by:  Patient Anesthesia (see MAR for exact dosages):    Anesthesia method:  None Laceration details:    Location:  Leg   Leg location:  L lower leg   Length (cm):  6   Depth (mm):  5 Repair type:    Repair type:  Simple Exploration:    Contaminated: no   Treatment:    Amount of cleaning:  Standard   Irrigation solution:  Sterile saline   Visualized foreign bodies/material removed: no   Skin repair:    Repair method:  Tissue adhesive Approximation:    Approximation:  Close Post-procedure details:    Dressing:  Open (no dressing)   Patient tolerance of  procedure:  Tolerated well, no immediate complications       Medications  LORazepam (ATIVAN) injection 0.5 mg (0.5 mg Intramuscular Given 05/08/20 1906)  traMADol (ULTRAM) tablet 50 mg (50 mg Oral Given 05/08/20 1951)  ondansetron (ZOFRAN-ODT) disintegrating tablet 4 mg (4 mg Oral Given 05/08/20 1951)     ____________________________________________   INITIAL IMPRESSION / ASSESSMENT AND PLAN / ED COURSE  Pertinent labs & imaging results that were available during my care of the patient were reviewed by me and considered in my medical decision making (see chart for details).      Assessment and plan Skin tear 85 year old female presents to the emergency department with a skin tear along the left anterior shin repaired in the emergency department using Dermabond.  CT head revealed no evidence of intracranial bleed or skull fracture.  Tramadol and Zofran was given in the emergency department for pain.  Patient was discharged with a short course of Keflex.  All patient questions were answered.    ____________________________________________  FINAL CLINICAL IMPRESSION(S) / ED DIAGNOSES  Final diagnoses:  Skin tear of left lower leg without complication, initial encounter      NEW MEDICATIONS STARTED DURING THIS VISIT:  ED Discharge Orders         Ordered    cephALEXin (KEFLEX) 500 MG capsule  2 times daily     Discontinue  Reprint     05/08/20 2013              This chart was dictated using voice recognition software/Dragon. Despite best efforts to proofread, errors can occur which can change the meaning. Any change was purely unintentional.     Lannie Fields, PA-C 05/08/20 2206    Vanessa Keyes, MD 05/11/20 [phone]

## 2020-05-08 NOTE — ED Triage Notes (Signed)
Pt in via EMS from [facility] with c/o fall. Pt with laceration to mid left shin. According to facility the laceration is 5 inches long. Bleeding controlled at this time. Pt has dementia and not able to communicate

## 2020-05-21 ENCOUNTER — Emergency Department
Admission: EM | Admit: 2020-05-21 | Discharge: 2020-05-21 | Disposition: A | Discharge status: Skilled Nursing Facility | Payer: Medicare Other | Attending: Emergency Medicine | Admitting: Emergency Medicine

## 2020-05-21 ENCOUNTER — Emergency Department: Payer: Medicare Other

## 2020-05-21 ENCOUNTER — Other Ambulatory Visit: Payer: Self-pay

## 2020-05-21 DIAGNOSIS — W07XXXA Fall from chair, initial encounter: Secondary | ICD-10-CM | POA: Diagnosis not present

## 2020-05-21 DIAGNOSIS — E119 Type 2 diabetes mellitus without complications: Secondary | ICD-10-CM | POA: Insufficient documentation

## 2020-05-21 DIAGNOSIS — Z794 Long term (current) use of insulin: Secondary | ICD-10-CM | POA: Insufficient documentation

## 2020-05-21 DIAGNOSIS — Y9389 Activity, other specified: Secondary | ICD-10-CM | POA: Diagnosis not present

## 2020-05-21 DIAGNOSIS — I10 Essential (primary) hypertension: Secondary | ICD-10-CM | POA: Diagnosis not present

## 2020-05-21 DIAGNOSIS — Y998 Other external cause status: Secondary | ICD-10-CM | POA: Diagnosis not present

## 2020-05-21 DIAGNOSIS — W19XXXA Unspecified fall, initial encounter: Secondary | ICD-10-CM

## 2020-05-21 DIAGNOSIS — S0003XA Contusion of scalp, initial encounter: Secondary | ICD-10-CM | POA: Diagnosis not present

## 2020-05-21 DIAGNOSIS — Z87891 Personal history of nicotine dependence: Secondary | ICD-10-CM | POA: Insufficient documentation

## 2020-05-21 DIAGNOSIS — S0990XA Unspecified injury of head, initial encounter: Secondary | ICD-10-CM | POA: Diagnosis present

## 2020-05-21 DIAGNOSIS — E039 Hypothyroidism, unspecified: Secondary | ICD-10-CM | POA: Insufficient documentation

## 2020-05-21 DIAGNOSIS — Z7982 Long term (current) use of aspirin: Secondary | ICD-10-CM | POA: Diagnosis not present

## 2020-05-21 DIAGNOSIS — F0390 Unspecified dementia without behavioral disturbance: Secondary | ICD-10-CM | POA: Diagnosis not present

## 2020-05-21 DIAGNOSIS — L03116 Cellulitis of left lower limb: Secondary | ICD-10-CM

## 2020-05-21 DIAGNOSIS — Z79899 Other long term (current) drug therapy: Secondary | ICD-10-CM | POA: Insufficient documentation

## 2020-05-21 DIAGNOSIS — Y9289 Other specified places as the place of occurrence of the external cause: Secondary | ICD-10-CM | POA: Diagnosis not present

## 2020-05-21 MED ORDER — SULFAMETHOXAZOLE-TRIMETHOPRIM 400-80 MG PO TABS
2.0000 | ORAL_TABLET | Freq: Two times a day (BID) | ORAL | 0 refills | Status: DC
Start: 2020-05-21 — End: 2020-05-21

## 2020-05-21 MED ORDER — CEPHALEXIN 500 MG PO CAPS: 500.0000 mg | ORAL_CAPSULE | Freq: Four times a day (QID) | ORAL | 0 refills | Status: AC

## 2020-05-21 MED ORDER — SULFAMETHOXAZOLE-TRIMETHOPRIM 400-80 MG PO TABS
2.0000 | ORAL_TABLET | Freq: Two times a day (BID) | ORAL | 0 refills | Status: AC
Start: 2020-05-21 — End: 2020-05-28

## 2020-05-21 MED ORDER — CEPHALEXIN 500 MG PO CAPS: 500.0000 mg | ORAL_CAPSULE | Freq: Four times a day (QID) | ORAL | 0 refills | Status: DC

## 2020-05-21 NOTE — ED Notes (Signed)
Pt unable to sign for d/c due to condition. Pt's family member and nursing home verbalize understanding of d/c instructions.

## 2020-05-21 NOTE — ED Notes (Signed)
Secretary Belinda informed pt in need of EMS transport to [facility]

## 2020-05-21 NOTE — ED Provider Notes (Addendum)
[facility] Emergency Department Provider Note   ____________________________________________   First MD Initiated Contact with Patient 05/21/20 [phone]     (approximate)  I have reviewed the triage vital signs and the nursing notes.   HISTORY  Chief Complaint Fall    HPI Miranda Clay is a 85 y.o. female with past medical history of hypertension, hyperlipidemia, diabetes, and dementia who presents to the ED following fall.  History is limited due to patient's baseline dementia, EMS states that staff at [facility] found patient to have slid out of her chair onto the floor.  She is oriented to person only, which is her usual baseline.  Patient currently denies any complaints including headache, neck pain, extremity pain, chest pain, or abdominal pain.  She is not anticoagulated.        Past Medical History:  Diagnosis Date  . Dementia ([place])   . Diabetes mellitus without complication ([place])   . Hyperlipidemia   . Hypertension   . Hypothyroidism     Patient Active Problem List   Diagnosis Date Noted  . Protein-calorie malnutrition, severe 04/03/2020  . Acute lower UTI 04/02/2020  . UTI (urinary tract infection) 04/02/2020  . Dementia ([place])   . Diabetes mellitus with hyperglycemia ([place])   . Hypothyroidism   . Hypertension   . Frequent falls     History reviewed. No pertinent surgical history.  Prior to Admission medications   Medication Sig Start Date End Date Taking? Authorizing Provider  aspirin EC 81 MG tablet Take 81 mg by mouth daily.   Yes [provider]  Cholecalciferol (VITAMIN D) 2000 UNITS tablet Take 2,000 Units by mouth daily.   Yes [provider]  doxycycline (VIBRAMYCIN) 100 MG capsule Take 100 mg by mouth 2 (two) times daily. 05/19/20  Yes [provider]  glipiZIDE (GLUCOTROL) 10 MG tablet Take 10 mg by mouth daily at 6 (six) AM. 04/25/20  Yes [provider]  levothyroxine (SYNTHROID, LEVOTHROID)  25 MCG tablet Take 25 mcg by mouth daily before breakfast.   Yes [provider]  lisinopril (ZESTRIL) 5 MG tablet Take 5 mg by mouth daily.   Yes [provider]  metFORMIN (GLUCOPHAGE) 1000 MG tablet Take 1,000 mg by mouth 2 (two) times daily.    Yes [provider]  PARoxetine (PAXIL) 10 MG tablet Take 10 mg by mouth daily.   Yes [provider]  pravastatin (PRAVACHOL) 20 MG tablet Take 20 mg by mouth at bedtime.    Yes [provider]  verapamil (CALAN-SR) 180 MG CR tablet Take 180 mg by mouth daily.   Yes [provider]  acetaminophen (TYLENOL) 500 MG tablet Take 500 mg by mouth 3 (three) times daily.    [provider]  diphenhydrAMINE (BENADRYL) 25 MG tablet Take 25 mg by mouth every 6 (six) hours as needed for itching or allergies.    [provider]  insulin glargine (LANTUS) 100 UNIT/ML injection Inject 0.06 mLs (6 Units total) into the skin daily. Patient not taking: Reported on 05/21/2020 04/04/20   Edwin Dada, MD  memantine (NAMENDA) 10 MG tablet Take 10 mg by mouth 2 (two) times daily. Patient not taking: Reported on 05/21/2020    [provider]  ondansetron (ZOFRAN ODT) 4 MG disintegrating tablet Take 1 tablet (4 mg total) by mouth every 8 (eight) hours as needed. Patient not taking: Reported on 05/21/2020 02/11/20   Lavonia Drafts, MD    Allergies Codeine  History reviewed. No pertinent family history.  Social History Social History   Tobacco Use  . Smoking status: Former Research scientist (life sciences)  . Smokeless tobacco: Never Used  Vaping Use  . Vaping Use: Never used  Substance Use Topics  . Alcohol use: No  . Drug use: No    Review of Systems  Constitutional: No fever/chills.  Positive for fall. Eyes: No visual changes. ENT: No sore throat. Cardiovascular: Denies chest pain. Respiratory: Denies shortness of breath. Gastrointestinal: No abdominal pain.  No nausea, no vomiting.  No diarrhea.   No constipation. Genitourinary: Negative for dysuria. Musculoskeletal: Negative for back pain. Skin: Negative for rash. Neurological: Negative for headaches, focal weakness or numbness.  ____________________________________________   PHYSICAL EXAM:  VITAL SIGNS: ED Triage Vitals  Enc Vitals Group     BP      Pulse      Resp      Temp      Temp src      SpO2      Weight      Height      Head Circumference      Peak Flow      Pain Score      Pain Loc      Pain Edu?      Excl. in [place]?     Constitutional: Alert and oriented to person, but not place or time. Eyes: Conjunctivae are normal. Head: Atraumatic. Nose: No congestion/rhinnorhea. Mouth/Throat: Mucous membranes are moist. Neck: Normal ROM Cardiovascular: Normal rate, regular rhythm. Grossly normal heart sounds. Respiratory: Normal respiratory effort.  No retractions. Lungs CTAB. Gastrointestinal: Soft and nontender. No distention. Genitourinary: deferred Musculoskeletal: No lower extremity tenderness nor edema. Neurologic:  Normal speech and language. No gross focal neurologic deficits are appreciated. Skin: Healing skin tears noted to left anterior shin with mild erythema and warmth extending down towards ankle. Psychiatric: Mood and affect are normal. Speech and behavior are normal.  ____________________________________________   LABS (all labs ordered are listed, but only abnormal results are displayed)  Labs Reviewed - No data to display ____________________________________________  EKG  ED ECG REPORT I, Blake Divine, the attending physician, personally viewed and interpreted this ECG.   Date: 05/21/2020  EKG Time: 8:26  Rate: 72  Rhythm: normal sinus rhythm  Axis: Normal  Intervals:none  ST&T Change: None   PROCEDURES  Procedure(s) performed (including Critical Care):  Procedures  CLINICAL DATA: Head injury and neck pain after fall today.  EXAM: CT HEAD WITHOUT CONTRAST  CT  CERVICAL SPINE WITHOUT CONTRAST  TECHNIQUE: Multidetector CT imaging of the head and cervical spine was performed following the standard protocol without intravenous contrast. Multiplanar CT image reconstructions of the cervical spine were also generated.  COMPARISON: May 08, 2020. May 05, 2020.  FINDINGS: CT HEAD FINDINGS  Brain: Stable calcified meningioma is noted in the left cerebellopontine angle. Mild diffuse cortical atrophy is noted. Mild chronic ischemic white matter disease is noted. There is no evidence of acute infarction or hemorrhage. No mass effect or midline shift is noted. Ventricular size is within normal limits.  Vascular: No hyperdense vessel or unexpected calcification.  Skull: Normal. Negative for fracture or focal lesion.  Sinuses/Orbits: No acute finding.  Other: Small left posterior scalp hematoma is noted.  CT CERVICAL SPINE FINDINGS  Alignment: Minimal grade 1 retrolisthesis is noted at C3-4.  Skull base and vertebrae: No acute fracture. No primary bone lesion or focal pathologic process.  Soft tissues and spinal canal: No prevertebral fluid or  swelling. No visible canal hematoma.  Disc levels: Moderate degenerative disc disease is noted at C3-4, C4-5, C5-6 and C6-7.  Upper chest: Negative.  Other: None.  IMPRESSION: 1. Small left posterior scalp hematoma. Mild diffuse cortical atrophy. Mild chronic ischemic white matter disease. No acute intracranial abnormality seen. 2. Multilevel degenerative disc disease. No acute abnormality seen in the cervical spine.   Electronically Signed By: Marijo Conception M.D. On: 05/21/2020 09:03 ____________________________________________   INITIAL IMPRESSION / ASSESSMENT AND PLAN / ED COURSE       85 year old female with history of dementia, hypertension, hyperlipidemia, and diabetes presents to the ED from memory care facility after being found to have fallen out of her chair.  Patient  currently denies any complaints and there are no obvious signs of trauma, she does have healing skin tear to left shin with some skin changes concerning for cellulitis.  EKG shows no evidence of arrhythmia or ischemia, we will screen CT head and C-spine, but if these are negative patient would be appropriate for discharge back to living facility.  CT head and C-spine are negative for acute process, reads not available in epic but were visible in PACS and are copied above.  Patient remains at her baseline mental status without any complaints is an appropriate for discharge back to nursing facility.  In speaking with her daughter, redness around her skin tear has persisted despite course of antibiotics.  Per paperwork, patient has been taking doxycycline at the nursing facility.  We will change her antibiotics to Keflex and Bactrim for improved strep coverage, counseled to have patient follow up with her PCP and otherwise return to the ED for new or worsening symptoms.  Patient and daughter agree with plan.      ____________________________________________   FINAL CLINICAL IMPRESSION(S) / ED DIAGNOSES  Final diagnoses:  Fall, initial encounter  Hematoma of scalp, initial encounter     ED Discharge Orders    None       Note:  This document was prepared using Dragon voice recognition software and may include unintentional dictation errors.   Blake Divine, MD 05/21/20 1015    Blake Divine, MD 05/21/20 1030

## 2020-05-21 NOTE — ED Notes (Signed)
Spoke with Nettie Elm at [facility] per daughter's request. Per Magda Paganini, pt will be discharging from the ER to [facility]. She asked that this RN give her 15 mins to prepare before calling for report. The # to call for report is [phone]

## 2020-05-21 NOTE — ED Notes (Signed)
Pt otf for CT

## 2020-05-21 NOTE — ED Triage Notes (Addendum)
Pt arrives via [facility] from [facility] for staff reports that pt slid out of her chair onto the floor. Pt w/ hx of dementia and is alert to person only. Speech slow but clear. Pt has skin tear to left leg and contusion to the back of her head from a previous fall. Pt has yellow DNR form. Per patient's daughter pt is supposed to move facilities today and she is supposed to be released to [facility] today and not [facility]

## 2020-05-21 NOTE — ED Notes (Signed)
Report to [facility] @ [facility]

## 2020-05-21 NOTE — ED Notes (Signed)
Dressing applied to left shin. Daughter updated on plan of care and discharge. Daughter verbalizes understanding of d/c instructions and medication regimen.

## 2020-10-21 DEATH — deceased

## 2021-12-24 IMAGING — CT CT CERVICAL SPINE W/O CM
3 of 4 series · 10 of 33 positions shown, 12 images · non-contrast
Comparison: CT head 04/02/2020, CT head and cervical spine
01/07/2020

CLINICAL DATA: Head trauma, witnessed fall

EXAM:
CT HEAD WITHOUT CONTRAST
CT CERVICAL SPINE WITHOUT CONTRAST
TECHNIQUE: Multidetector CT imaging of the head and cervical spine was
performed following the standard protocol without intravenous
contrast. Multiplanar CT image reconstructions of the cervical spine
were also generated.

[Series 6: sagittal bone · sagittal · 0.23mm/px · 5 of 61 slices shown, 6 images]
[im 21/61  bone]
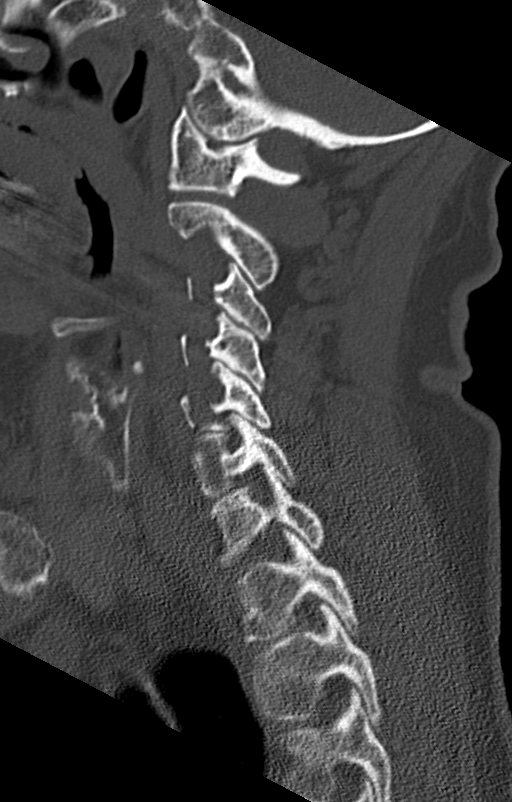
[im 26/61  bone]
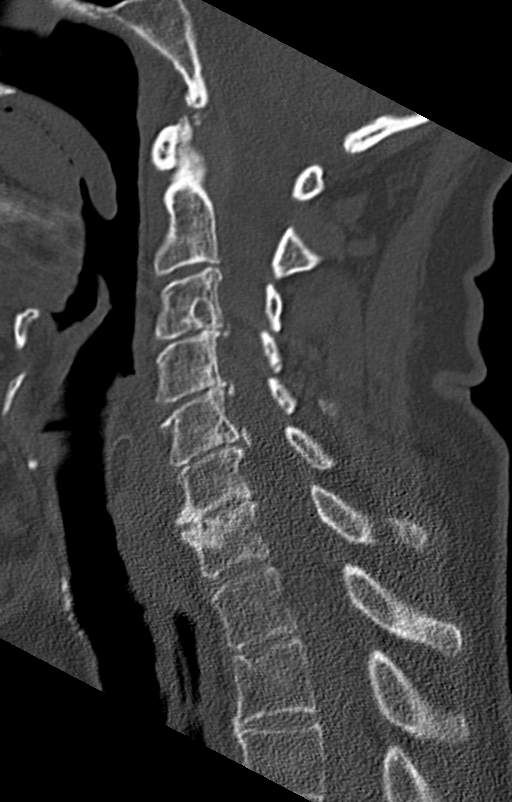
[im 31/61  soft-tissue]
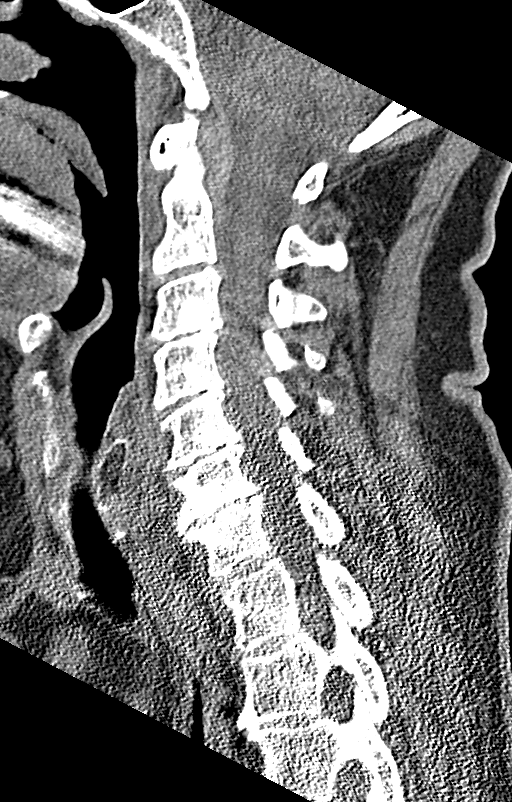
[im 31/61  bone]
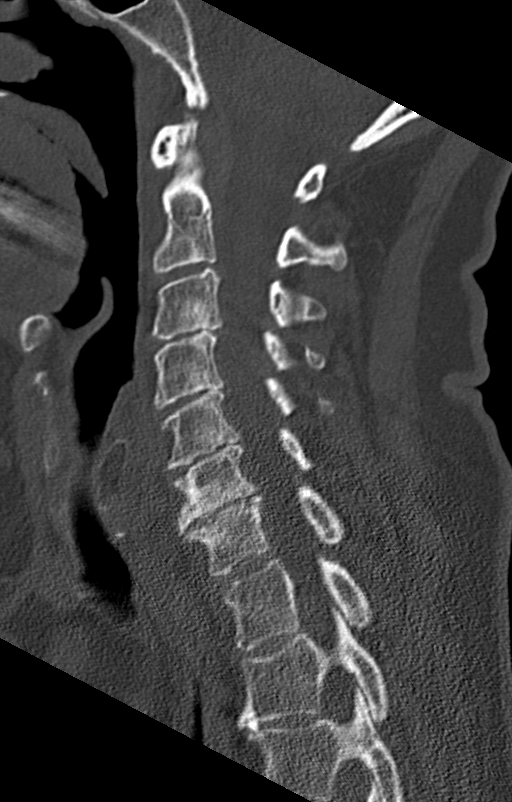
[im 36/61  bone]
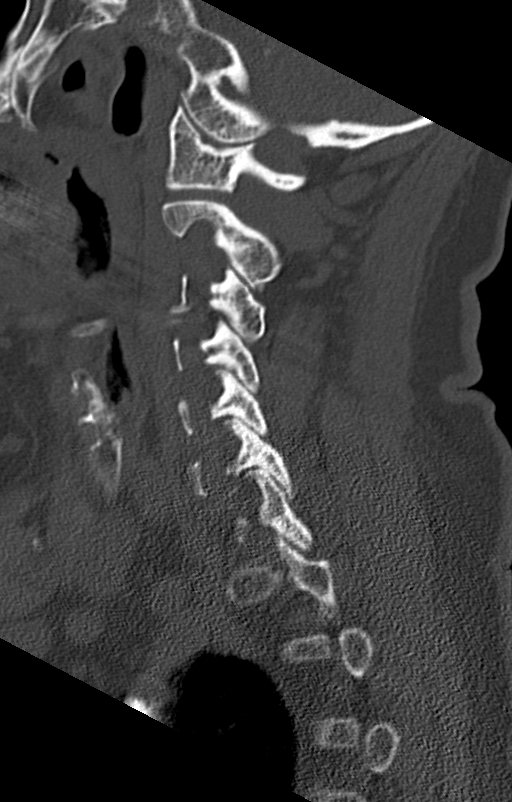
[im 41/61  bone]
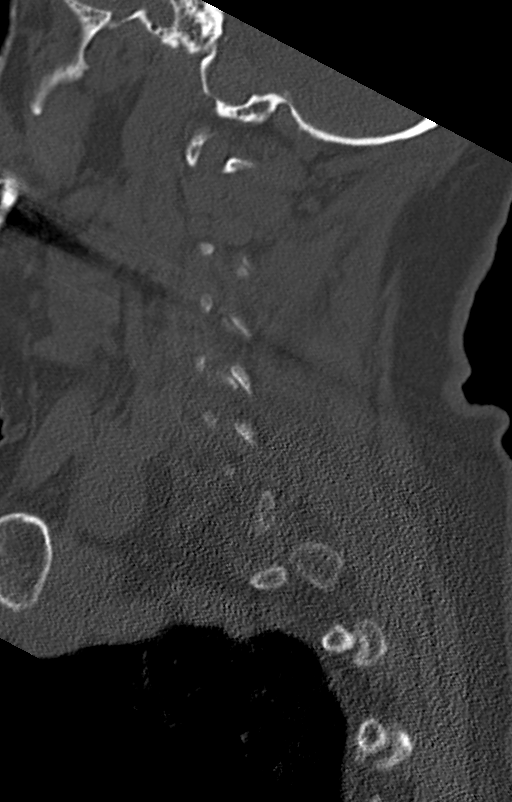

[Series 7: coronal bone · coronal · 0.23mm/px · 3 of 61 slices shown]
[im 15/61  bone]
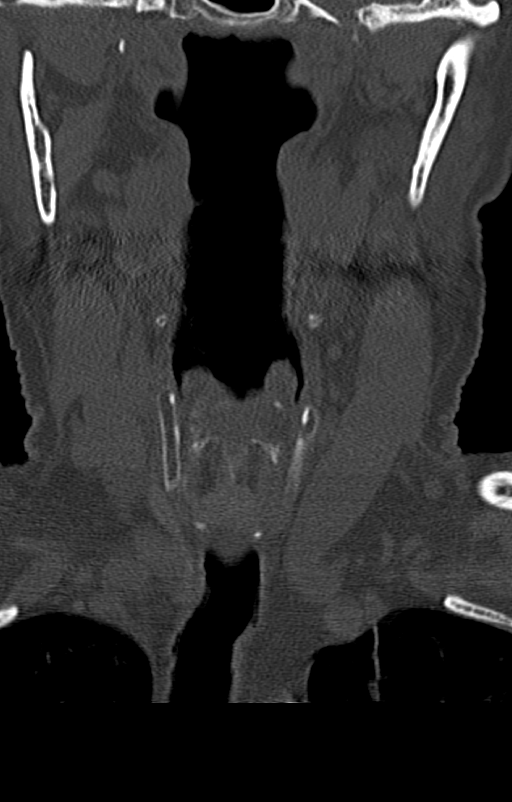
[im 25/61  bone]
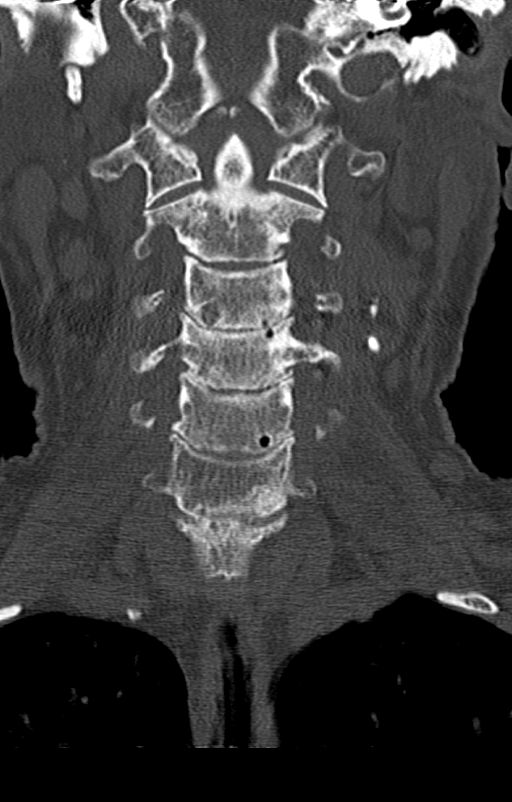
[im 36/61  bone]
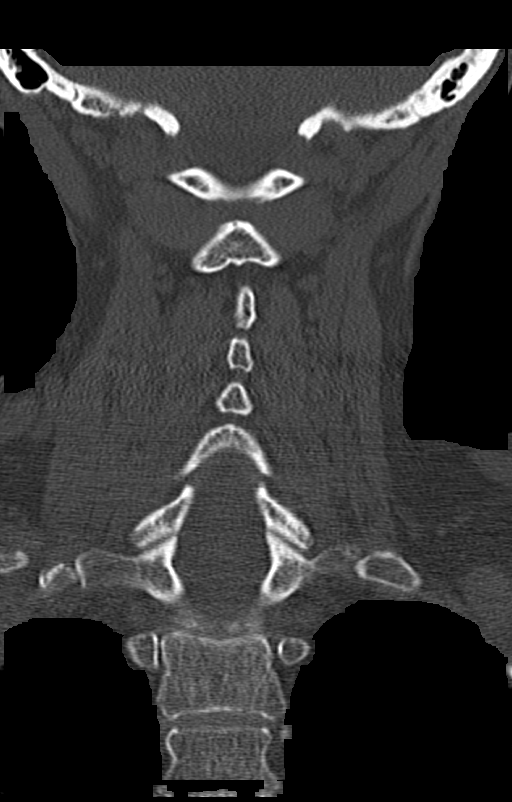

[Series 8: orthogonal bone · axial · 0.23mm/px · z∈[+75,+147]mm · 2 of 95 slices shown, 3 images]
[im 27/95  soft-tissue]
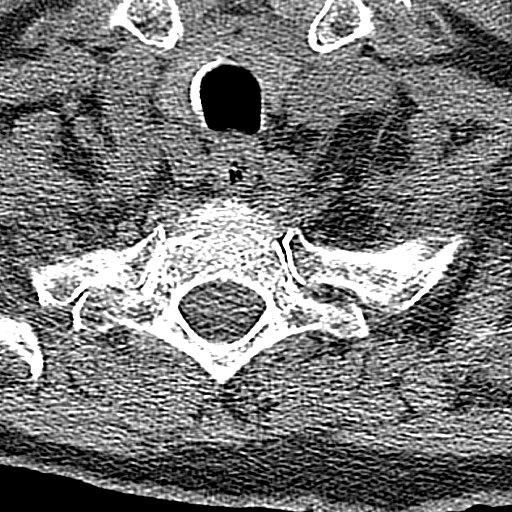
[im 27/95  bone]
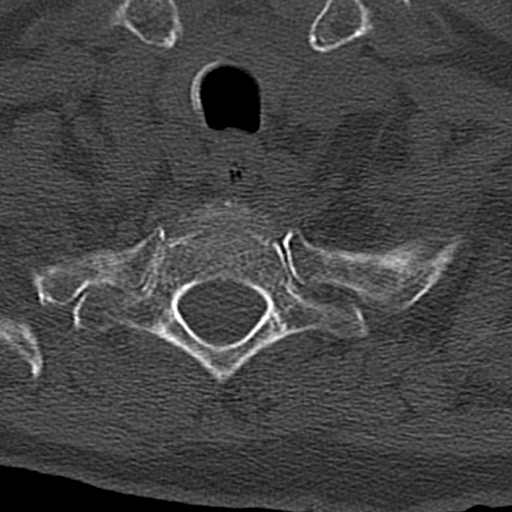
[im 68/95  bone]
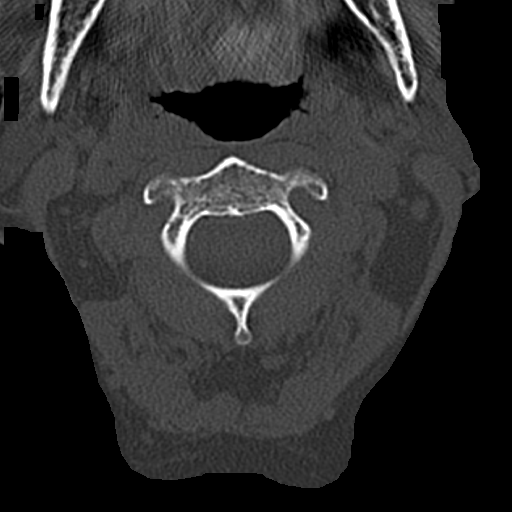

[10 of 33 positions shown; findings below may reference images not displayed]

FINDINGS: CT HEAD FINDINGS

Brain: Region of remote encephalomalacia in the left anterior
temporal pole is unchanged from comparison CT. Stable appearance of
a calcified meningioma at the left cerebellopontine angle measuring
approximately 2.1 by 1.7 cm in maximal dimensions, unchanged from
prior exam with minimal adjacent mass effect upon the left
cerebellar hemisphere. No associated vasogenic edema. No evidence of
acute infarction, hemorrhage, hydrocephalus, extra-axial collection,
new mass lesion or mass effect. Symmetric prominence of the
ventricles, cisterns and sulci compatible with parenchymal volume
loss. Patchy areas of white matter hypoattenuation are most
compatible with chronic microvascular angiopathy.

Vascular: Atherosclerotic calcification of the carotid siphons. No
hyperdense vessel.

Skull: There is soft tissue thickening and mild swelling with a
crescentic hematoma measuring up to 3 mm in maximal thickness over
the left parieto-occipital scalp (axial 53). No subjacent calvarial
fracture. Minimal hyperostotic changes of the left petrous apex and
otic capsule adjacent the meningioma detailed above.

Sinuses/Orbits: Minimal thickening in the right maxillary sinus,
largely beyond the level of imaging. Remaining paranasal sinuses and
mastoid air cells are predominantly clear. Included orbital
structures are unremarkable.

Other: None

CT CERVICAL SPINE FINDINGS

Alignment: Stabilization collar absent at the time of examination.
Slight cervical flexion on scout view. Mild degenerative
anterolisthesis C2 on 3 and retrolisthesis C3 on 4 is unchanged from
comparison. No evidence of traumatic listhesis. No abnormally
widened, perched or jumped facets. Normal alignment of the
craniocervical and atlantoaxial articulations.

Skull base and vertebrae: No acute skull base fracture, vertebral
body fracture, vertebral body height loss or suspicious osseous
lesions. Moderate arthrosis at the C1-2 articulation as well as a
degenerative spur/osteophyte at the base and dens interval as well.
Appearance is unchanged from comparison. Additional multilevel
spondylitic changes detailed below.

Soft tissues and spinal canal: No pre or paravertebral fluid or
swelling. No visible canal hematoma.

Disc levels: Multilevel intervertebral disc height loss with
spondylitic endplate changes. Multilevel disc osteophyte complexes
result in at most mild canal stenosis most pronounced C5-6.
Multilevel uncinate spurring and facet hypertrophic changes result
in mild-to-moderate multilevel neural foraminal narrowing more
moderate severe bilateral foraminal narrowing C5-6 as well.

Upper chest: No acute abnormality in the upper chest or imaged lung
apices.

Other: No concerning thyroid nodules. Calcification in the proximal
great vessels and cervical carotids.
IMPRESSION: 1. Mild contusive changes with a crescentic hematoma over the left
parieto-occipital scalp. No subjacent calvarial fracture.
2. No evidence of acute intracranial abnormality.
3. Chronic encephalomalacia in the left anterior temporal pole, can
be sequela prior parenchymal contusion or less likely infarct.
4. Unchanged appearance of a calcified meningioma at the left
cerebellopontine angle with minimal hyperostotic features of the
left petrous apex and otic capsule.
5. Stable parenchymal volume loss and chronic microvascular
angiopathy.
6. No acute fracture or traumatic listhesis of the cervical spine.
7. Multilevel spondylitic changes of the cervical spine as
described. Features maximal C5-6.
8. Intracranial and cervical atherosclerosis.
